# Patient Record
Sex: Male | Born: 1975
Health system: Southern US, Community
[De-identification: ages and names within clinical notes are randomized; demographics above are authoritative.]

## PROBLEM LIST (undated history)

## (undated) DIAGNOSIS — R740 Nonspecific elevation of levels of transaminase and lactic acid dehydrogenase [LDH]: Secondary | ICD-10-CM

## (undated) DIAGNOSIS — R7401 Elevation of levels of liver transaminase levels: Secondary | ICD-10-CM

## (undated) DIAGNOSIS — G40909 Epilepsy, unspecified, not intractable, without status epilepticus: Secondary | ICD-10-CM

## (undated) DIAGNOSIS — R7402 Elevation of levels of lactic acid dehydrogenase (LDH): Secondary | ICD-10-CM

## (undated) DIAGNOSIS — G809 Cerebral palsy, unspecified: Secondary | ICD-10-CM

## (undated) HISTORY — DX: Epilepsy, unspecified, not intractable, without status epilepticus: G40.909

## (undated) HISTORY — DX: Elevation of levels of liver transaminase levels: R74.01

## (undated) HISTORY — DX: Cerebral palsy, unspecified: G80.9

## (undated) HISTORY — DX: Elevation of levels of lactic acid dehydrogenase (LDH): R74.02

## (undated) HISTORY — DX: Nonspecific elevation of levels of transaminase and lactic acid dehydrogenase (ldh): R74.0

---

## 2003-02-24 ENCOUNTER — Encounter: Payer: Self-pay | Admitting: Internal Medicine

## 2005-03-28 ENCOUNTER — Ambulatory Visit: Payer: Self-pay | Admitting: Hospitalist

## 2006-08-14 ENCOUNTER — Telehealth (INDEPENDENT_AMBULATORY_CARE_PROVIDER_SITE_OTHER): Payer: Self-pay | Admitting: Hospitalist

## 2006-10-24 ENCOUNTER — Ambulatory Visit: Payer: Self-pay | Admitting: *Deleted

## 2006-10-24 DIAGNOSIS — G40909 Epilepsy, unspecified, not intractable, without status epilepticus: Secondary | ICD-10-CM

## 2006-10-24 DIAGNOSIS — G809 Cerebral palsy, unspecified: Secondary | ICD-10-CM | POA: Insufficient documentation

## 2007-12-31 ENCOUNTER — Telehealth: Payer: Self-pay | Admitting: *Deleted

## 2008-02-10 ENCOUNTER — Encounter: Payer: Self-pay | Admitting: Internal Medicine

## 2008-02-10 ENCOUNTER — Ambulatory Visit: Payer: Self-pay | Admitting: *Deleted

## 2008-02-13 ENCOUNTER — Encounter: Payer: Self-pay | Admitting: Licensed Clinical Social Worker

## 2008-02-13 LAB — CONVERTED CEMR LAB
ALT: 14 units/L (ref 0–53)
Basophils Absolute: 0 10*3/uL (ref 0.0–0.1)
CO2: 21 meq/L (ref 19–32)
Eosinophils Relative: 3 % (ref 0–5)
HCT: 41.9 % (ref 39.0–52.0)
Lymphocytes Relative: 20 % (ref 12–46)
Neutro Abs: 3.8 10*3/uL (ref 1.7–7.7)
Neutrophils Relative %: 70 % (ref 43–77)
Platelets: 218 10*3/uL (ref 150–400)
RDW: 13.5 % (ref 11.5–15.5)
Sodium: 148 meq/L — ABNORMAL HIGH (ref 135–145)
TSH: 1.549 microintl units/mL (ref 0.350–4.50)
Total Bilirubin: 0.4 mg/dL (ref 0.3–1.2)
Total Protein: 8.2 g/dL (ref 6.0–8.3)

## 2008-03-26 ENCOUNTER — Encounter: Payer: Self-pay | Admitting: Internal Medicine

## 2008-03-27 ENCOUNTER — Ambulatory Visit: Payer: Self-pay | Admitting: Internal Medicine

## 2008-03-27 DIAGNOSIS — M7989 Other specified soft tissue disorders: Secondary | ICD-10-CM

## 2008-05-18 ENCOUNTER — Encounter: Payer: Self-pay | Admitting: Internal Medicine

## 2009-01-04 ENCOUNTER — Ambulatory Visit: Payer: Self-pay | Admitting: Internal Medicine

## 2009-01-04 ENCOUNTER — Encounter: Payer: Self-pay | Admitting: Internal Medicine

## 2009-01-04 DIAGNOSIS — R740 Nonspecific elevation of levels of transaminase and lactic acid dehydrogenase [LDH]: Secondary | ICD-10-CM

## 2009-01-04 LAB — CONVERTED CEMR LAB
ALT: 11 units/L (ref 0–53)
Albumin: 4.1 g/dL (ref 3.5–5.2)
Alkaline Phosphatase: 75 units/L (ref 39–117)
Basophils Absolute: 0 10*3/uL (ref 0.0–0.1)
Basophils Relative: 0 % (ref 0–1)
Eosinophils Absolute: 0.2 10*3/uL (ref 0.0–0.7)
Glucose, Bld: 62 mg/dL — ABNORMAL LOW (ref 70–99)
HCV Ab: NEGATIVE
Hepatitis B Surface Ag: NEGATIVE
MCHC: 33.4 g/dL (ref 30.0–36.0)
MCV: 77.7 fL — ABNORMAL LOW (ref 78.0–100.0)
Monocytes Relative: 9 % (ref 3–12)
Neutro Abs: 3.4 10*3/uL (ref 1.7–7.7)
Neutrophils Relative %: 62 % (ref 43–77)
Platelets: 190 10*3/uL (ref 150–400)
RDW: 12.9 % (ref 11.5–15.5)
Total Bilirubin: 0.3 mg/dL (ref 0.3–1.2)
Total Protein: 7.3 g/dL (ref 6.0–8.3)

## 2009-01-12 ENCOUNTER — Encounter: Payer: Self-pay | Admitting: Internal Medicine

## 2009-01-27 ENCOUNTER — Telehealth: Payer: Self-pay | Admitting: Internal Medicine

## 2009-02-11 ENCOUNTER — Telehealth: Payer: Self-pay | Admitting: Internal Medicine

## 2009-02-26 ENCOUNTER — Ambulatory Visit: Payer: Self-pay | Admitting: Infectious Diseases

## 2009-02-26 ENCOUNTER — Encounter: Payer: Self-pay | Admitting: Internal Medicine

## 2009-02-26 DIAGNOSIS — L851 Acquired keratosis [keratoderma] palmaris et plantaris: Secondary | ICD-10-CM

## 2009-07-19 ENCOUNTER — Encounter: Payer: Self-pay | Admitting: Internal Medicine

## 2009-07-26 ENCOUNTER — Encounter: Payer: Self-pay | Admitting: Internal Medicine

## 2009-12-23 ENCOUNTER — Encounter: Payer: Self-pay | Admitting: Ophthalmology

## 2009-12-23 ENCOUNTER — Ambulatory Visit: Payer: Self-pay | Admitting: Internal Medicine

## 2009-12-23 ENCOUNTER — Encounter: Payer: Self-pay | Admitting: Internal Medicine

## 2009-12-24 LAB — CONVERTED CEMR LAB
AST: 14 units/L (ref 0–37)
Albumin: 4.5 g/dL (ref 3.5–5.2)
BUN: 16 mg/dL (ref 6–23)
Basophils Absolute: 0 10*3/uL (ref 0.0–0.1)
CO2: 23 meq/L (ref 19–32)
Calcium: 9.8 mg/dL (ref 8.4–10.5)
Chloride: 109 meq/L (ref 96–112)
Cholesterol: 170 mg/dL (ref 0–200)
Hemoglobin: 14.5 g/dL (ref 13.0–17.0)
Lymphocytes Relative: 19 % (ref 12–46)
Lymphs Abs: 1.3 10*3/uL (ref 0.7–4.0)
Monocytes Absolute: 0.5 10*3/uL (ref 0.1–1.0)
Neutro Abs: 4.9 10*3/uL (ref 1.7–7.7)
Platelets: 236 10*3/uL (ref 150–400)
Potassium: 3.8 meq/L (ref 3.5–5.3)
RDW: 12.7 % (ref 11.5–15.5)
Triglycerides: 49 mg/dL (ref <150)
WBC: 6.7 10*3/uL (ref 4.0–10.5)

## 2010-06-08 ENCOUNTER — Encounter: Payer: Self-pay | Admitting: Ophthalmology

## 2010-07-26 NOTE — Assessment & Plan Note (Signed)
Summary: ACUTE-REGULAR CHECK UP([facility]O)/CFB   Vital Signs:  Patient profile:   35 year old male Height:      67 inches (170.18 cm) Pulse rate:   92 / minute BP sitting:   128 / 81  (left arm) Cuff size:   small  Vitals Entered By: Theotis Barrio NT II (December 23, 2009 2:13 PM) CC: YEARLY CLINIC VISIT  /  MEDICATION REFILL /  FORMS TO BE FILLED OUT / HERE WITH SISITER Austin [name] Is Patient Diabetic? No Pain Assessment Patient in pain? no       Does patient need assistance? Ambulation Impaired:Risk for fall Comments PATIENT IS TOTAL CARE / HIS CARE TAKER IS HIS SISTER Austin [name]   Primary Care Provider:  Hartley Barefoot MD  CC:  YEARLY CLINIC VISIT  /  MEDICATION REFILL /  FORMS TO BE FILLED OUT / HERE WITH SISITER Austin [name].  History of Present Illness: Austin [name] is a 35 yo man with PMH as outlined below.  Specifically he has what appears to be severe cerebral palsy and does not really communicate during the interview.  He is here with his sister who denies any active issues.  He is taking his medication for seizures without problems.  Eating well and interacting at baseline.  They are here for refills and some paperwork.  Of note, he is only taking carbamazepine once daily.  Preventive Screening-Counseling & Management  Alcohol-Tobacco     Smoking Status: never  Current Medications (verified): 1)  Carbamazepine 200 Mg Tabs (Carbamazepine) .... Take 1 Tablet By Mouth Once A Day  Allergies (verified): No Known Drug Allergies  Past History:  Past Medical History: Last updated: 10/24/2006 Cerebral Palsy Seizure Disorder  Risk Factors: Smoking Status: never (12/23/2009)  Physical Exam  General:  sitting in wheechair, constant moving, occaisonal random sounds. Head:  large area of alopecia Eyes:  anicteric Lungs:  normal respiratory effort, no accessory muscle use, normal breath sounds, no crackles, and no wheezes.   Heart:  normal rate, regular rhythm, no murmur, no  gallop, and no rub.   Abdomen:  normal bowel sounds.   Extremities:  no edema Neurologic:  mentally retarded. wheel chair bound. unable to assess the orientation.  Psych:  unable to assess   Impression & Recommendations:  Problem # 1:  SEIZURE DISORDER (ICD-780.39) will check level (should be drawn before morning dose), renal fnx, LFTs, lipids, CBC Continue current medication Paperwork filled out  >25 minutes face to face  His updated medication list for this problem includes:    Carbamazepine 200 Mg Tabs (Carbamazepine) .Marland Kitchen... Take 1 tablet by mouth once a day  Problem # 2:  CEREBRAL PALSY (ICD-343.9) At baseline per sister.  Complete Medication List: 1)  Carbamazepine 200 Mg Tabs (Carbamazepine) .... Take 1 tablet by mouth once a day  Other Orders: T-Comprehensive Metabolic Panel [phone]) T-Lipid Profile ([id number]) T-CBC w/Diff ([id number]) T- * Misc. Laboratory test [phone]) Ophthalmology Referral (Ophthalmology)  Patient Instructions: 1)  Please schedule a follow-up appointment in 6 months. 2)  Continue medications below. 3)  Will check labs today, if there is any problem we will call you. 4)  Make sure to contact Austin [name] about transportation question. 5)  Will need an eye exam becasue of the carbamazepine. 6)  Medication refilled. Prescriptions: CARBAMAZEPINE 200 MG TABS (CARBAMAZEPINE) Take 1 tablet by mouth once a day  #30 x 6   Entered and Authorized by:   Mariea Stable MD   Signed by:   Mariea Stable  MD on 12/23/2009   Method used:   [facility] to        [facility]  [address] [phone]* (retail)       [address]       [place], [place]  [address]       Ph: [phone] or [phone]       Fax: [phone]   RxID:   [id number]  Process Orders Check Orders Results:     [facility]: ABN not required for this insurance Tests Sent for requisitioning (December 23, 2009 3:16 PM):     12/23/2009: [facility] -- T-Comprehensive Metabolic Panel [[id number]] (signed)     12/23/2009: [facility] -- T-Lipid Profile [id number] (signed)     12/23/2009: [facility] -- T-CBC w/Diff [[id number]] (signed)     12/23/2009: [facility] -- T- * Misc. Laboratory test [phone] (signed)    Prevention & Chronic Care Immunizations   Influenza vaccine: Not documented    Tetanus booster: 01/04/2009: Td    Pneumococcal vaccine: Not documented  Other Screening   Smoking status: never  (12/23/2009)  Lipids   Total Cholesterol: Not documented   LDL: Not documented   LDL Direct: Not documented   HDL: Not documented   Triglycerides: Not documented   Appended Document: ACUTE-REGULAR CHECK UP([facility]O)/CFB    Clinical Lists Changes        Problem # 13:  ENCOUNTER FOR THERAPEUTIC DRUG MONITORING (ICD-V58.83) ophthalmology referral ordered during visit for chronic carbamazepine use  Problem # 14:  SEIZURE DISORDER (ICD-780.39) ophthalmology referral ordered during visit for chronic carbamazepine use  His updated medication list for this problem includes:    Carbamazepine 200 Mg Tabs (Carbamazepine) .Marland Kitchen... Take 1 tablet by mouth once a day   Complete Medication List: 1)  Carbamazepine 200 Mg Tabs (Carbamazepine) .... Take 1 tablet by mouth once a day

## 2010-07-26 NOTE — Miscellaneous (Signed)
Summary: [facility]: Verbal Order  [facility]: Verbal Order   Imported By: Florinda Marker 07/20/2009 13:44:35  _____________________________________________________________________  External Attachment:    Type:   Image     Comment:   External Document

## 2010-07-26 NOTE — Miscellaneous (Signed)
Summary: [facility]: Verbal Order  [facility]: Verbal Order   Imported By: Florinda Marker 07/28/2009 16:35:37  _____________________________________________________________________  External Attachment:    Type:   Image     Comment:   External Document

## 2010-07-26 NOTE — Consult Note (Signed)
Summary: [facility]  [facility]   Imported By: Margie Billet 12/28/2009 11:41:32  _____________________________________________________________________  External Attachment:    Type:   Image     Comment:   External Document

## 2010-07-28 NOTE — Miscellaneous (Signed)
Summary: [facility] ORDERS  ADVANCED ORDERS   Imported By: Shon Hough 07/08/2010 13:44:56  _____________________________________________________________________  External Attachment:    Type:   Image     Comment:   External Document

## 2010-08-08 ENCOUNTER — Ambulatory Visit (INDEPENDENT_AMBULATORY_CARE_PROVIDER_SITE_OTHER): Payer: Medicare Other | Admitting: Ophthalmology

## 2010-08-08 ENCOUNTER — Encounter: Payer: Self-pay | Admitting: Ophthalmology

## 2010-08-08 DIAGNOSIS — L659 Nonscarring hair loss, unspecified: Secondary | ICD-10-CM | POA: Insufficient documentation

## 2010-08-08 DIAGNOSIS — G809 Cerebral palsy, unspecified: Secondary | ICD-10-CM

## 2010-08-08 DIAGNOSIS — R569 Unspecified convulsions: Secondary | ICD-10-CM

## 2010-08-08 LAB — COMPREHENSIVE METABOLIC PANEL
Albumin: 4.2 g/dL (ref 3.5–5.2)
BUN: 14 mg/dL (ref 6–23)
Calcium: 9 mg/dL (ref 8.4–10.5)
Chloride: 107 mEq/L (ref 96–112)
Glucose, Bld: 95 mg/dL (ref 70–99)
Potassium: 3.7 mEq/L (ref 3.5–5.3)

## 2010-08-08 MED ORDER — CARBAMAZEPINE 200 MG PO TABS: 200.0000 mg | ORAL_TABLET | Freq: Every day | ORAL | Status: DC

## 2010-08-08 NOTE — Assessment & Plan Note (Addendum)
Given that the patient has full care requirements including bathing , I will write a letter to the home health agency today stating the patient's medical conditions requiring this assistance.  Because the patient's needs and capabilities were not completely clear at this time, I did order a referral for physical therapy to see the patient to  determine which durable medical equipment may best serve the patient and his family.  We will prescribe based on their recommendations.

## 2010-08-08 NOTE — Assessment & Plan Note (Addendum)
The patient is currently on carbamazepine, in the sister denies any new seizures. We'll continue the patient's current dose at this time. I will  Refill his medication today.  Also check a repeat seen at the followup on the patient's hepatic function given his Tegretol use.

## 2010-08-08 NOTE — Patient Instructions (Addendum)
You'll need to get her medical records to get the past progress notes. I'm referring the patient for physical therapy evaluation , so that they can assess his needs and determine what medical clinic he really needs. You can pick up a ruber cover for your finger while brushing [facility] at [facility] or other similar stores. I like to see the patient again in 3 months for routine checkup. Please call earlier if you have further concerns.

## 2010-08-08 NOTE — Assessment & Plan Note (Signed)
The patient has a significant area of alopecia across his posterior scalp. There are several pustules surrounding the periphery. This is reportedly stable, another concern to the patient or family. I will Continue to monitor this,   And Will refer him to dermatology if it worsens.

## 2010-08-08 NOTE — Progress Notes (Signed)
  Subjective:    Patient ID: Austin [name], male    DOB: 1975/08/13, 35 y.o.   MRN: [id number]  HPI  This is a 35 year old male with past medical history significant for cerebral palsy and seizure disorder, who presents  For routine followup.  No history is obtainable from the patient due to his mental retardation, however, the patient's guardian was with him today. The patient reportedly has no new problems, and his seizures have been under good control. The patient's sister has currently been taking care the patient full time, and needs assistance with this. The patient has now been arranged to receive home health care with a nursing aide,  but requests a note defining his medical condition at this time.  The patient's sister also requests prescriptions for several durable medical items, including a walker which would allow the patient to stand independently while strapped in/supported,  and a rubber tip to go over her finger when trying to brush his teeth. The patient may be contacted at [phone] if needed.  Review of Systems Unable to obtain.     Objective:   Physical Exam  Constitutional: He appears well-developed and well-nourished.  HENT:  Head: Normocephalic and atraumatic.        The patient has significant alopecia which appears to be alopecia areata located on the posterior scalp. This is reportedly stable.  Eyes: Pupils are equal, round, and reactive to light.  Cardiovascular: Normal rate, regular rhythm and intact distal pulses.  Exam reveals no gallop and no friction rub.   No murmur heard. Pulmonary/Chest: Effort normal and breath sounds normal. He has no wheezes. He has no rales.  Abdominal: Soft. Bowel sounds are normal. He exhibits no distension. There is no tenderness.  Musculoskeletal: Normal range of motion.  Neurological: He is alert. No cranial nerve deficit.  Skin: Rash noted.       See hent   Psychiatric:        The patient is mentally retarded and incapable of  cooperating with physical exam.       Current Outpatient Prescriptions on File Prior to Visit  Medication Sig Dispense Refill  . DISCONTD: carbamazepine (TEGRETOL) 200 MG tablet Take 200 mg by mouth daily.          Past Medical History  Diagnosis Date  . Nonspecific elevation of levels of transaminase and lactic acid dehydrogenase (LDH)   . Seizure disorder   . Cerebral palsy       Assessment & Plan:

## 2010-08-22 ENCOUNTER — Telehealth: Payer: Self-pay | Admitting: *Deleted

## 2010-08-22 ENCOUNTER — Ambulatory Visit: Payer: Medicare Other | Admitting: Physical Therapy

## 2010-08-22 NOTE — Telephone Encounter (Signed)
[facility] after being notified [facility] had failed

## 2010-08-29 ENCOUNTER — Encounter: Payer: Self-pay | Admitting: Ophthalmology

## 2010-11-16 ENCOUNTER — Encounter: Payer: Medicare Other | Admitting: Ophthalmology

## 2010-11-16 ENCOUNTER — Ambulatory Visit (INDEPENDENT_AMBULATORY_CARE_PROVIDER_SITE_OTHER): Payer: Medicare Other | Admitting: Internal Medicine

## 2010-11-16 ENCOUNTER — Encounter: Payer: Self-pay | Admitting: Internal Medicine

## 2010-11-16 VITALS — BP 130/88 | HR 68 | Temp 98.6°F | Ht 68.0 in

## 2010-11-16 DIAGNOSIS — L0292 Furuncle, unspecified: Secondary | ICD-10-CM

## 2010-11-16 DIAGNOSIS — L0293 Carbuncle, unspecified: Secondary | ICD-10-CM

## 2010-11-16 DIAGNOSIS — Z0289 Encounter for other administrative examinations: Secondary | ICD-10-CM

## 2010-11-16 DIAGNOSIS — R569 Unspecified convulsions: Secondary | ICD-10-CM

## 2010-11-16 DIAGNOSIS — L219 Seborrheic dermatitis, unspecified: Secondary | ICD-10-CM

## 2010-11-16 MED ORDER — CARBAMAZEPINE 200 MG PO TABS: 200.0000 mg | ORAL_TABLET | Freq: Every day | ORAL | Status: DC

## 2010-11-16 MED ORDER — KETOCONAZOLE 2 % EX SHAM: MEDICATED_SHAMPOO | CUTANEOUS | Status: AC

## 2010-11-16 NOTE — Progress Notes (Signed)
  Subjective:    Patient ID: Austin [name], male    DOB: 03/07/1976, 35 y.o.   MRN: [id number]  HPI Patient is a 35 year old man with cerebral palsy, moderate to severe MR, requiring total care from his sister, who also cares for another sibling (sister) with mild MR. He is non-verbal. Has a history of grand mal siezures with good control on Tegretol-last seizure 2 months ago, which did not prompt evaluation.  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 No current fluctuance, fevers or other systemic symptoms (such as listlesness or decreased po intake) His sister notes he does quite a bit of head bobbing and repetitive head movements, which may aggravate his scalp. Scant drainage on the pillow. Last visit to a dermatologist-she cannot recall who-in [place] 6-7 years ago.  Also has seborrheah  Good appetite, fluid intake-eats solid food without difficulty.   Review of Systems Not obtainable from patient-as above    Objective:   Physical Exam  HENT:  Head:     Slightly agitated man in wheelchair, bobbing back and forth, not verbal OP-very poor dentition Neck supple, FROM Lungs CTA CAR RRR no MRG Ext- no lesions       Assessment & Plan:  1. Multi-page forms filled in for daycare program-see scanned paperwork, and check PPD 2. Dermatology referral 3. Nizoril for seborrhea 4. Tegretol level

## 2010-11-17 LAB — CARBAMAZEPINE LEVEL, TOTAL: Carbamazepine Lvl: 4.8 ug/mL (ref 4.0–12.0)

## 2010-11-23 ENCOUNTER — Encounter: Payer: Medicare Other | Admitting: Ophthalmology

## 2011-03-06 ENCOUNTER — Telehealth: Payer: Self-pay | Admitting: *Deleted

## 2011-03-06 NOTE — Telephone Encounter (Signed)
Call from [facility] stating pt has requested PCS from her company and they need records stating pt's diag and need of PCS,  Given med records fax# for release of info and request

## 2011-04-19 ENCOUNTER — Other Ambulatory Visit: Payer: Self-pay | Admitting: Ophthalmology

## 2011-09-15 ENCOUNTER — Inpatient Hospital Stay (HOSPITAL_COMMUNITY)
Admission: EM | Admit: 2011-09-15 | Discharge: 2011-09-19 | DRG: 572 | Disposition: A | Discharge status: Home / Self Care | Payer: Medicare Other | Source: Ambulatory Visit | Attending: Internal Medicine | Admitting: Internal Medicine

## 2011-09-15 ENCOUNTER — Emergency Department (HOSPITAL_COMMUNITY): Payer: Medicare Other

## 2011-09-15 ENCOUNTER — Encounter (HOSPITAL_COMMUNITY): Payer: Self-pay | Admitting: Emergency Medicine

## 2011-09-15 DIAGNOSIS — L659 Nonscarring hair loss, unspecified: Secondary | ICD-10-CM | POA: Diagnosis present

## 2011-09-15 DIAGNOSIS — G40909 Epilepsy, unspecified, not intractable, without status epilepticus: Secondary | ICD-10-CM | POA: Diagnosis present

## 2011-09-15 DIAGNOSIS — L089 Local infection of the skin and subcutaneous tissue, unspecified: Secondary | ICD-10-CM | POA: Diagnosis not present

## 2011-09-15 DIAGNOSIS — L02519 Cutaneous abscess of unspecified hand: Secondary | ICD-10-CM | POA: Diagnosis not present

## 2011-09-15 DIAGNOSIS — G809 Cerebral palsy, unspecified: Secondary | ICD-10-CM | POA: Diagnosis not present

## 2011-09-15 DIAGNOSIS — M7989 Other specified soft tissue disorders: Secondary | ICD-10-CM | POA: Diagnosis present

## 2011-09-15 DIAGNOSIS — L03119 Cellulitis of unspecified part of limb: Secondary | ICD-10-CM

## 2011-09-15 DIAGNOSIS — D72829 Elevated white blood cell count, unspecified: Secondary | ICD-10-CM | POA: Diagnosis present

## 2011-09-15 DIAGNOSIS — Z79899 Other long term (current) drug therapy: Secondary | ICD-10-CM

## 2011-09-15 DIAGNOSIS — L0292 Furuncle, unspecified: Secondary | ICD-10-CM | POA: Diagnosis present

## 2011-09-15 DIAGNOSIS — L02529 Furuncle unspecified hand: Secondary | ICD-10-CM | POA: Diagnosis present

## 2011-09-15 DIAGNOSIS — L02539 Carbuncle of unspecified hand: Secondary | ICD-10-CM | POA: Diagnosis present

## 2011-09-15 DIAGNOSIS — D649 Anemia, unspecified: Secondary | ICD-10-CM | POA: Diagnosis present

## 2011-09-15 LAB — BASIC METABOLIC PANEL
CO2: 22 mEq/L (ref 19–32)
Calcium: 9.3 mg/dL (ref 8.4–10.5)
Chloride: 104 mEq/L (ref 96–112)
Glucose, Bld: 126 mg/dL — ABNORMAL HIGH (ref 70–99)
Potassium: 3.7 mEq/L (ref 3.5–5.1)
Sodium: 139 mEq/L (ref 135–145)

## 2011-09-15 LAB — DIFFERENTIAL
Basophils Relative: 0 % (ref 0–1)
Eosinophils Relative: 0 % (ref 0–5)
Lymphocytes Relative: 3 % — ABNORMAL LOW (ref 12–46)
Monocytes Absolute: 2.3 10*3/uL — ABNORMAL HIGH (ref 0.1–1.0)
Monocytes Relative: 9 % (ref 3–12)
Neutrophils Relative %: 88 % — ABNORMAL HIGH (ref 43–77)

## 2011-09-15 LAB — CBC
Hemoglobin: 13.5 g/dL (ref 13.0–17.0)
MCH: 26.5 pg (ref 26.0–34.0)
MCHC: 34.4 g/dL (ref 30.0–36.0)
MCV: 77 fL — ABNORMAL LOW (ref 78.0–100.0)
RBC: 5.09 MIL/uL (ref 4.22–5.81)

## 2011-09-15 MED ORDER — SODIUM CHLORIDE 0.9 % IV BOLUS (SEPSIS)
1000.0000 mL | Freq: Once | INTRAVENOUS | Status: AC
  Administered 2011-09-15: 1000 mL via INTRAVENOUS

## 2011-09-15 MED ORDER — LORAZEPAM 2 MG/ML IJ SOLN
1.0000 mg | INTRAMUSCULAR | Status: DC
  Administered 2011-09-16 (×2): 1 mg via INTRAVENOUS
  Filled 2011-09-15 (×2): qty 1

## 2011-09-15 MED ORDER — CLINDAMYCIN PHOSPHATE 600 MG/50ML IV SOLN
600.0000 mg | Freq: Once | INTRAVENOUS | Status: AC
  Administered 2011-09-15: 600 mg via INTRAVENOUS
  Filled 2011-09-15: qty 50

## 2011-09-15 NOTE — ED Notes (Signed)
Onset 3 days ago possible spider left hand currently left hand red swollen and painful.  Family member at beside states history of CP in wheelchair and has a care taker at home.

## 2011-09-15 NOTE — ED Provider Notes (Signed)
History     CSN: [id number]  Arrival date & time 09/15/11  1755   First MD Initiated Contact with Patient 09/15/11 2027      Chief Complaint  Patient presents with  . Insect Bite    (Consider location/radiation/quality/duration/timing/severity/associated sxs/prior treatment) HPI Comments: Patient with a history of Cerebral palsy and seizures presents emergency department with chief complaint of left hand and thumb pain.  Patient was brought in the emergency department by his nephew who has provided history.  Onset of pain is unknown because patient's wife left town on Tuesday and his caretaker did not notice the hand for 3 days.  Associated symptoms include swelling, your edema, fevers, and purulent drainage.  Patient unable to respond to other review of system questions-level V caveat. No known hx of DM, steroids, CA, or IV use. Last meal unknown.   The history is provided by the patient.    Past Medical History  Diagnosis Date  . Nonspecific elevation of levels of transaminase and lactic acid dehydrogenase (LDH)   . Seizure disorder   . Cerebral palsy     History reviewed. No pertinent past surgical history.  No family history on file.  History  Substance Use Topics  . Smoking status: Never Smoker   . Smokeless tobacco: Not on file  . Alcohol Use: No      Review of Systems  Unable to perform ROS: Other    Allergies  Review of patient's allergies indicates no known allergies.  Home Medications   Current Outpatient Rx  Name Route Sig Dispense Refill  . CARBAMAZEPINE ER 200 MG PO CP12 Oral Take 200 mg by mouth daily.      BP 130/100  Pulse 120  Temp(Src) 98.4 F (36.9 C) (Axillary)  Resp 22  SpO2 98%  Physical Exam  Nursing note and vitals reviewed. Constitutional: He is oriented to person, place, and time. He appears well-developed and well-nourished. No distress.       Baseline non commutative, wheelchair bound. Appears to have bad social care, not  clean.    HENT:  Head: Normocephalic and atraumatic.  Eyes: Conjunctivae and EOM are normal.  Neck: Normal range of motion.  Cardiovascular: Normal heart sounds.        Mild tachycardia 106  Pulmonary/Chest: Effort normal.  Musculoskeletal: Normal range of motion.       Left hand swollen from 3rd digit to thumb. Erythema, swelling, warmth & blistering of skin on both palmer and dorsal surface. Extreme ttp. 1st digit with purulent drainage, skin sloughing and malodor.   Neurological: He is alert and oriented to person, place, and time.  Skin: Skin is warm and dry. No rash noted. He is not diaphoretic.  Psychiatric: He has a normal mood and affect. His behavior is normal.    ED Course  Procedures (including critical care time)  Labs Reviewed  CBC - Abnormal; Notable for the following:    WBC 25.8 (*)    MCV 77.0 (*)    All other components within normal limits  DIFFERENTIAL - Abnormal; Notable for the following:    Neutrophils Relative 88 (*)    Lymphocytes Relative 3 (*)    Neutro Abs 22.7 (*)    Monocytes Absolute 2.3 (*)    All other components within normal limits  BASIC METABOLIC PANEL - Abnormal; Notable for the following:    Glucose, Bld 126 (*)    All other components within normal limits  CULTURE, BLOOD (ROUTINE X 2)  CULTURE,  BLOOD (ROUTINE X 2)   Dg Hand Complete Left  09/15/2011  *RADIOLOGY REPORT*  Clinical Data: Severe left hand swelling and infection.  Evaluate for osteomyelitis.  LEFT HAND - COMPLETE 3+ VIEW  Comparison: None.  Findings: Soft tissue swelling is seen involving the palm and proximal index finger.  No evidence of bone destruction, lucency, or periosteal reaction.  No evidence of fracture or dislocation. No evidence of arthropathy.  No evidence of soft tissue gas or radiopaque foreign body.  IMPRESSION: Soft tissue swelling involving the palmar and proximal index finger.  No osseous abnormality.  Original Report Authenticated By: Danae Orleans, M.D.      1. Cellulitis of hand       MDM  Labs, BC, and xray pending. R/o osteomyelitis vs simple cellulitis. Started on clindamycin. Likely admit d/t level of infection, comorbidities and inappropriate home health care.   No osteo on xray, Hand surgery consulted for likely surgery.    Pt has been admitted to [facility] and is in line for hand surgery. Pt given ativan in ED for anxiety and seizure prevention. Discussed plan with patient family who is agreeable.         Jaci Carrel, [place] 09/16/11 [phone]

## 2011-09-15 NOTE — ED Notes (Signed)
Patient presents with family stating he has some kind of insect bite to his left hand.  Left hand swollen, red, warm to touch with yellowish drainage

## 2011-09-16 ENCOUNTER — Encounter (HOSPITAL_COMMUNITY): Payer: Self-pay | Admitting: Internal Medicine

## 2011-09-16 ENCOUNTER — Emergency Department (HOSPITAL_COMMUNITY): Payer: Medicare Other | Admitting: Anesthesiology

## 2011-09-16 ENCOUNTER — Encounter (HOSPITAL_COMMUNITY)
Admission: EM | Disposition: A | Discharge status: Home / Self Care | Payer: Self-pay | Source: Ambulatory Visit | Attending: Internal Medicine

## 2011-09-16 ENCOUNTER — Encounter (HOSPITAL_COMMUNITY): Payer: Self-pay | Admitting: Anesthesiology

## 2011-09-16 DIAGNOSIS — D649 Anemia, unspecified: Secondary | ICD-10-CM | POA: Diagnosis present

## 2011-09-16 DIAGNOSIS — L02519 Cutaneous abscess of unspecified hand: Secondary | ICD-10-CM | POA: Diagnosis not present

## 2011-09-16 DIAGNOSIS — L02529 Furuncle unspecified hand: Secondary | ICD-10-CM | POA: Diagnosis not present

## 2011-09-16 DIAGNOSIS — M7989 Other specified soft tissue disorders: Secondary | ICD-10-CM | POA: Diagnosis not present

## 2011-09-16 DIAGNOSIS — D7289 Other specified disorders of white blood cells: Secondary | ICD-10-CM | POA: Diagnosis not present

## 2011-09-16 DIAGNOSIS — L089 Local infection of the skin and subcutaneous tissue, unspecified: Secondary | ICD-10-CM | POA: Diagnosis not present

## 2011-09-16 DIAGNOSIS — L659 Nonscarring hair loss, unspecified: Secondary | ICD-10-CM | POA: Diagnosis present

## 2011-09-16 DIAGNOSIS — Z79899 Other long term (current) drug therapy: Secondary | ICD-10-CM | POA: Diagnosis not present

## 2011-09-16 DIAGNOSIS — D72829 Elevated white blood cell count, unspecified: Secondary | ICD-10-CM | POA: Diagnosis present

## 2011-09-16 DIAGNOSIS — G809 Cerebral palsy, unspecified: Secondary | ICD-10-CM | POA: Diagnosis not present

## 2011-09-16 DIAGNOSIS — R112 Nausea with vomiting, unspecified: Secondary | ICD-10-CM | POA: Diagnosis not present

## 2011-09-16 DIAGNOSIS — G40909 Epilepsy, unspecified, not intractable, without status epilepticus: Secondary | ICD-10-CM | POA: Diagnosis present

## 2011-09-16 DIAGNOSIS — L03119 Cellulitis of unspecified part of limb: Secondary | ICD-10-CM | POA: Diagnosis not present

## 2011-09-16 HISTORY — PX: I & D EXTREMITY: SHX5045

## 2011-09-16 LAB — CBC
MCH: 26.7 pg (ref 26.0–34.0)
MCHC: 34.9 g/dL (ref 30.0–36.0)
Platelets: 181 10*3/uL (ref 150–400)
RBC: 4.72 MIL/uL (ref 4.22–5.81)
RDW: 13 % (ref 11.5–15.5)

## 2011-09-16 LAB — CREATININE, SERUM: Creatinine, Ser: 0.83 mg/dL (ref 0.50–1.35)

## 2011-09-16 SURGERY — IRRIGATION AND DEBRIDEMENT EXTREMITY
Anesthesia: General | Site: Hand | Laterality: Left | Wound class: Dirty or Infected

## 2011-09-16 MED ORDER — PIPERACILLIN-TAZOBACTAM 3.375 G IVPB 30 MIN
3.3750 g | Freq: Three times a day (TID) | INTRAVENOUS | Status: DC
  Administered 2011-09-16 – 2011-09-19 (×11): 3.375 g via INTRAVENOUS
  Filled 2011-09-16 (×14): qty 50

## 2011-09-16 MED ORDER — ONDANSETRON HCL 4 MG PO TABS: 4.0000 mg | ORAL_TABLET | Freq: Four times a day (QID) | ORAL | Status: DC | PRN

## 2011-09-16 MED ORDER — GLYCOPYRROLATE 0.2 MG/ML IJ SOLN
INTRAMUSCULAR | Status: DC | PRN
  Administered 2011-09-16: .5 mg via INTRAVENOUS

## 2011-09-16 MED ORDER — SODIUM CHLORIDE 0.9 % IV SOLN
INTRAVENOUS | Status: AC
  Administered 2011-09-16: 100 mL/h via INTRAVENOUS

## 2011-09-16 MED ORDER — NEOSTIGMINE METHYLSULFATE 1 MG/ML IJ SOLN
INTRAMUSCULAR | Status: DC | PRN
  Administered 2011-09-16: 3 mg via INTRAVENOUS

## 2011-09-16 MED ORDER — FENTANYL CITRATE 0.05 MG/ML IJ SOLN
INTRAMUSCULAR | Status: DC | PRN
  Administered 2011-09-16 (×2): 50 ug via INTRAVENOUS

## 2011-09-16 MED ORDER — BUPIVACAINE HCL (PF) 0.25 % IJ SOLN
INTRAMUSCULAR | Status: DC | PRN
  Administered 2011-09-16: 10 mL

## 2011-09-16 MED ORDER — CARBAMAZEPINE ER 200 MG PO CP12: 200.0000 mg | ORAL_CAPSULE | Freq: Every day | ORAL | Status: DC

## 2011-09-16 MED ORDER — MIDAZOLAM HCL 5 MG/5ML IJ SOLN
INTRAMUSCULAR | Status: DC | PRN
  Administered 2011-09-16: 2 mg via INTRAVENOUS

## 2011-09-16 MED ORDER — PROPOFOL 10 MG/ML IV EMUL
INTRAVENOUS | Status: DC | PRN
  Administered 2011-09-16: 50 mg via INTRAVENOUS
  Administered 2011-09-16: 150 mg via INTRAVENOUS

## 2011-09-16 MED ORDER — ONDANSETRON HCL 4 MG/2ML IJ SOLN: 4.0000 mg | Freq: Four times a day (QID) | INTRAMUSCULAR | Status: DC | PRN

## 2011-09-16 MED ORDER — CARBAMAZEPINE ER 200 MG PO TB12
200.0000 mg | ORAL_TABLET | Freq: Every day | ORAL | Status: DC
  Administered 2011-09-16 – 2011-09-19 (×4): 200 mg via ORAL
  Filled 2011-09-16 (×4): qty 1

## 2011-09-16 MED ORDER — OXYCODONE-ACETAMINOPHEN 5-325 MG PO TABS
1.0000 | ORAL_TABLET | Freq: Four times a day (QID) | ORAL | Status: DC | PRN
  Administered 2011-09-18: 2 via ORAL
  Administered 2011-09-18: 1 via ORAL
  Administered 2011-09-19: 2 via ORAL
  Filled 2011-09-16 (×2): qty 2
  Filled 2011-09-16: qty 1

## 2011-09-16 MED ORDER — LORAZEPAM 2 MG/ML IJ SOLN
1.0000 mg | Freq: Four times a day (QID) | INTRAMUSCULAR | Status: DC | PRN
  Administered 2011-09-19: 03:00:00 via INTRAVENOUS
  Administered 2011-09-19: 1 mg via INTRAVENOUS
  Filled 2011-09-16 (×2): qty 1

## 2011-09-16 MED ORDER — CLINDAMYCIN PHOSPHATE 900 MG/50ML IV SOLN
900.0000 mg | Freq: Three times a day (TID) | INTRAVENOUS | Status: DC
  Filled 2011-09-16 (×2): qty 50

## 2011-09-16 MED ORDER — ROCURONIUM BROMIDE 100 MG/10ML IV SOLN
INTRAVENOUS | Status: DC | PRN
  Administered 2011-09-16: 30 mg via INTRAVENOUS

## 2011-09-16 MED ORDER — LACTATED RINGERS IV SOLN
INTRAVENOUS | Status: DC | PRN
  Administered 2011-09-16 (×2): via INTRAVENOUS

## 2011-09-16 MED ORDER — FENTANYL CITRATE 0.05 MG/ML IJ SOLN: 25.0000 ug | INTRAMUSCULAR | Status: DC | PRN

## 2011-09-16 MED ORDER — ONDANSETRON HCL 4 MG/2ML IJ SOLN: 4.0000 mg | INTRAMUSCULAR | Status: DC | PRN

## 2011-09-16 MED ORDER — SODIUM CHLORIDE 0.9 % IR SOLN
Status: DC | PRN
  Administered 2011-09-16 (×2): 3000 mL

## 2011-09-16 MED ORDER — ONDANSETRON HCL 4 MG/2ML IJ SOLN
INTRAMUSCULAR | Status: DC | PRN
  Administered 2011-09-16: 4 mg via INTRAVENOUS

## 2011-09-16 MED ORDER — HEPARIN SODIUM (PORCINE) 5000 UNIT/ML IJ SOLN
5000.0000 [IU] | Freq: Three times a day (TID) | INTRAMUSCULAR | Status: DC
  Administered 2011-09-16 – 2011-09-19 (×9): 5000 [IU] via SUBCUTANEOUS
  Filled 2011-09-16 (×12): qty 1

## 2011-09-16 MED ORDER — WHITE PETROLATUM GEL
Status: AC
  Administered 2011-09-16: 14:00:00
  Filled 2011-09-16: qty 5

## 2011-09-16 MED ORDER — MORPHINE SULFATE 2 MG/ML IJ SOLN
2.0000 mg | INTRAMUSCULAR | Status: DC | PRN
  Administered 2011-09-18 – 2011-09-19 (×3): 2 mg via INTRAVENOUS
  Filled 2011-09-16 (×3): qty 1

## 2011-09-16 MED ORDER — METOPROLOL TARTRATE 1 MG/ML IV SOLN
INTRAVENOUS | Status: DC | PRN
  Administered 2011-09-16 (×5): 1 mg via INTRAVENOUS

## 2011-09-16 MED ORDER — VANCOMYCIN HCL IN DEXTROSE 1-5 GM/200ML-% IV SOLN
1000.0000 mg | Freq: Two times a day (BID) | INTRAVENOUS | Status: DC
  Administered 2011-09-16 – 2011-09-19 (×7): 1000 mg via INTRAVENOUS
  Filled 2011-09-16 (×10): qty 200

## 2011-09-16 MED ORDER — MORPHINE SULFATE 2 MG/ML IJ SOLN
2.0000 mg | Freq: Once | INTRAMUSCULAR | Status: AC
  Administered 2011-09-16: 2 mg via INTRAVENOUS
  Filled 2011-09-16: qty 1

## 2011-09-16 MED ORDER — SODIUM CHLORIDE 0.9 % IV SOLN
INTRAVENOUS | Status: DC
  Administered 2011-09-16 – 2011-09-17 (×3): via INTRAVENOUS

## 2011-09-16 SURGICAL SUPPLY — 54 items
BANDAGE COBAN STERILE 2 (GAUZE/BANDAGES/DRESSINGS) IMPLANT
BANDAGE CONFORM 2  STR LF (GAUZE/BANDAGES/DRESSINGS) IMPLANT
BANDAGE ELASTIC 3 VELCRO ST LF (GAUZE/BANDAGES/DRESSINGS) ×1 IMPLANT
BANDAGE ELASTIC 4 VELCRO ST LF (GAUZE/BANDAGES/DRESSINGS) ×2 IMPLANT
BANDAGE GAUZE ELAST BULKY 4 IN (GAUZE/BANDAGES/DRESSINGS) ×2 IMPLANT
BNDG CMPR 9X4 STRL LF SNTH (GAUZE/BANDAGES/DRESSINGS)
BNDG COHESIVE 1X5 TAN STRL LF (GAUZE/BANDAGES/DRESSINGS) IMPLANT
BNDG COHESIVE 4X5 TAN STRL (GAUZE/BANDAGES/DRESSINGS) ×1 IMPLANT
BNDG ESMARK 4X9 LF (GAUZE/BANDAGES/DRESSINGS) IMPLANT
CLOTH BEACON ORANGE TIMEOUT ST (SAFETY) ×2 IMPLANT
CORDS BIPOLAR (ELECTRODE) ×2 IMPLANT
COVER SURGICAL LIGHT HANDLE (MISCELLANEOUS) ×2 IMPLANT
DECANTER SPIKE VIAL GLASS SM (MISCELLANEOUS) ×1 IMPLANT
DRAIN PENROSE 1/4X12 LTX STRL (WOUND CARE) IMPLANT
DRSG ADAPTIC 3X8 NADH LF (GAUZE/BANDAGES/DRESSINGS) IMPLANT
DRSG EMULSION OIL 3X3 NADH (GAUZE/BANDAGES/DRESSINGS) ×1 IMPLANT
DRSG PAD ABDOMINAL 8X10 ST (GAUZE/BANDAGES/DRESSINGS) ×3 IMPLANT
GAUZE PACKING IODOFORM 1/2 (PACKING) ×1 IMPLANT
GAUZE XEROFORM 1X8 LF (GAUZE/BANDAGES/DRESSINGS) ×1 IMPLANT
GAUZE XEROFORM 5X9 LF (GAUZE/BANDAGES/DRESSINGS) ×1 IMPLANT
GLOVE BIO SURGEON STRL SZ7.5 (GLOVE) ×2 IMPLANT
GLOVE BIOGEL PI IND STRL 8 (GLOVE) ×1 IMPLANT
GLOVE BIOGEL PI INDICATOR 8 (GLOVE) ×1
GOWN STRL REIN XL XLG (GOWN DISPOSABLE) ×2 IMPLANT
HANDPIECE INTERPULSE COAX TIP (DISPOSABLE)
KIT BASIN OR (CUSTOM PROCEDURE TRAY) ×2 IMPLANT
KIT ROOM TURNOVER OR (KITS) ×2 IMPLANT
LOOP VESSEL MAXI BLUE (MISCELLANEOUS) IMPLANT
LOOP VESSEL MINI RED (MISCELLANEOUS) IMPLANT
MANIFOLD NEPTUNE II (INSTRUMENTS) ×2 IMPLANT
NDL HYPO 25X1 1.5 SAFETY (NEEDLE) IMPLANT
NEEDLE HYPO 25X1 1.5 SAFETY (NEEDLE) IMPLANT
NS IRRIG 1000ML POUR BTL (IV SOLUTION) ×2 IMPLANT
PACK ORTHO EXTREMITY (CUSTOM PROCEDURE TRAY) ×2 IMPLANT
PAD ARMBOARD 7.5X6 YLW CONV (MISCELLANEOUS) ×3 IMPLANT
SCRUB BETADINE 4OZ XXX (MISCELLANEOUS) ×1 IMPLANT
SET HNDPC FAN SPRY TIP SCT (DISPOSABLE) IMPLANT
SOLUTION BETADINE 4OZ (MISCELLANEOUS) ×2 IMPLANT
SPLINT FIBERGLASS 3X35 (CAST SUPPLIES) ×1 IMPLANT
SPONGE GAUZE 4X4 12PLY (GAUZE/BANDAGES/DRESSINGS) ×2 IMPLANT
SPONGE LAP 18X18 X RAY DECT (DISPOSABLE) ×2 IMPLANT
SPONGE LAP 4X18 X RAY DECT (DISPOSABLE) ×1 IMPLANT
SUCTION FRAZIER TIP 10 FR DISP (SUCTIONS) ×2 IMPLANT
SUT ETHILON 4 0 PS 2 18 (SUTURE) ×1 IMPLANT
SUT MON AB 5-0 P3 18 (SUTURE) IMPLANT
SYR CONTROL 10ML LL (SYRINGE) IMPLANT
TOWEL OR 17X24 6PK STRL BLUE (TOWEL DISPOSABLE) ×2 IMPLANT
TOWEL OR 17X26 10 PK STRL BLUE (TOWEL DISPOSABLE) ×2 IMPLANT
TUBE ANAEROBIC SPECIMEN COL (MISCELLANEOUS) IMPLANT
TUBE CONNECTING 12X1/4 (SUCTIONS) ×2 IMPLANT
TUBE FEEDING 5FR 15 INCH (TUBING) IMPLANT
UNDERPAD 30X30 INCONTINENT (UNDERPADS AND DIAPERS) ×2 IMPLANT
WATER STERILE IRR 1000ML POUR (IV SOLUTION) ×1 IMPLANT
YANKAUER SUCT BULB TIP NO VENT (SUCTIONS) ×1 IMPLANT

## 2011-09-16 NOTE — Op Note (Signed)
Dictation [phone]

## 2011-09-16 NOTE — Consult Note (Signed)
Austin [name] is an 36 y.o. male.   Chief Complaint: left hand infection HPI: 36 yo male with CP and noncommunicative.  Present with family member.  Reportedly began to have swelling and erythema of left hand two days ago.  No known wounds, but patient does put his hand in his mouth frequently.  Lives at home with family and caretaker.  Past Medical History  Diagnosis Date  . Nonspecific elevation of levels of transaminase and lactic acid dehydrogenase (LDH)   . Seizure disorder   . Cerebral palsy     History reviewed. No pertinent past surgical history.  No family history on file. Social History:  reports that he has never smoked. He does not have any smokeless tobacco history on file. He reports that he does not drink alcohol or use illicit drugs.  Allergies: No Known Allergies  Medications Prior to Admission  Medication Dose Route Frequency Provider Last Rate Last Dose  . 0.9 %  sodium chloride infusion   Intravenous STAT Lisette Paz, PA-C      . clindamycin (CLEOCIN) IVPB 600 mg  600 mg Intravenous Once [employer], PA-C   600 mg at 09/15/11 2144  . clindamycin (CLEOCIN) IVPB 900 mg  900 mg Intravenous Q8H Houston Siren, MD      . LORazepam (ATIVAN) injection 1 mg  1 mg Intravenous Q4H Lisette Paz, PA-C   1 mg at 09/16/11 0032  . morphine 2 MG/ML injection 2 mg  2 mg Intravenous Once Lisette Paz, PA-C   2 mg at 09/16/11 0123  . ondansetron (ZOFRAN) injection 4 mg  4 mg Intravenous PRN Lisette Paz, PA-C      . sodium chloride 0.9 % bolus 1,000 mL  1,000 mL Intravenous Once Lisette Paz, PA-C   1,000 mL at 09/15/11 2144   No current outpatient prescriptions on file as of 09/15/2011.    Results for orders placed during the hospital encounter of 09/15/11 (from the past 48 hour(s))  CBC     Status: Abnormal   Collection Time   09/15/11 10:44 PM      Component Value Range Comment   WBC 25.8 (*) 4.0 - 10.5 (K/uL)    RBC 5.09  4.22 - 5.81 (MIL/uL)    Hemoglobin 13.5  13.0 - 17.0 (g/dL)     HCT 16.1  09.6 - 04.5 (%)    MCV 77.0 (*) 78.0 - 100.0 (fL)    MCH 26.5  26.0 - 34.0 (pg)    MCHC 34.4  30.0 - 36.0 (g/dL)    RDW 40.9  81.1 - 91.4 (%)    Platelets 201  150 - 400 (K/uL)   DIFFERENTIAL     Status: Abnormal   Collection Time   09/15/11 10:44 PM      Component Value Range Comment   Neutrophils Relative 88 (*) 43 - 77 (%)    Lymphocytes Relative 3 (*) 12 - 46 (%)    Monocytes Relative 9  3 - 12 (%)    Eosinophils Relative 0  0 - 5 (%)    Basophils Relative 0  0 - 1 (%)    Neutro Abs 22.7 (*) 1.7 - 7.7 (K/uL)    Lymphs Abs 0.8  0.7 - 4.0 (K/uL)    Monocytes Absolute 2.3 (*) 0.1 - 1.0 (K/uL)    Eosinophils Absolute 0.0  0.0 - 0.7 (K/uL)    Basophils Absolute 0.0  0.0 - 0.1 (K/uL)    WBC Morphology TOXIC GRANULATION  BASIC METABOLIC PANEL     Status: Abnormal   Collection Time   09/15/11 10:44 PM      Component Value Range Comment   Sodium 139  135 - 145 (mEq/L)    Potassium 3.7  3.5 - 5.1 (mEq/L)    Chloride 104  96 - 112 (mEq/L)    CO2 22  19 - 32 (mEq/L)    Glucose, Bld 126 (*) 70 - 99 (mg/dL)    BUN 13  6 - 23 (mg/dL)    Creatinine, Ser 3.32  0.50 - 1.35 (mg/dL)    Calcium 9.3  8.4 - 10.5 (mg/dL)    GFR calc non Af Amer >90  >90 (mL/min)    GFR calc Af Amer >90  >90 (mL/min)     Dg Hand Complete Left  09/15/2011  *RADIOLOGY REPORT*  Clinical Data: Severe left hand swelling and infection.  Evaluate for osteomyelitis.  LEFT HAND - COMPLETE 3+ VIEW  Comparison: None.  Findings: Soft tissue swelling is seen involving the palm and proximal index finger.  No evidence of bone destruction, lucency, or periosteal reaction.  No evidence of fracture or dislocation. No evidence of arthropathy.  No evidence of soft tissue gas or radiopaque foreign body.  IMPRESSION: Soft tissue swelling involving the palmar and proximal index finger.  No osseous abnormality.  Original Report Authenticated By: Danae Orleans, M.D.     Review of systems not obtained due to patient  factors.  Blood pressure 130/100, pulse 120, temperature 98.4 F (36.9 C), temperature source Axillary, resp. rate 22, SpO2 98.00%.  General appearance: alert, appears stated age and slowed mentation Extremities: RUE: intact capillary refill.  moves fingers.  spastic.  LUE: swollen in hand.  thumb with active drainage dorsally.  especially ttp in thumb.  some ttp volar and dorsal hand.  erythema dorsally. Pulses: 2+ and symmetric Skin: as above Neurologic: Grossly normal   Assessment/Plan Left hand infection.  Thumb with active drainage and skin changes.  Difficult to determine exactly where abscess extent is due to poor cooperation and nonverbal nature of patient.  Recommend OR for I&D of left hand to include left thumb and possibly dorsum and volar surface of hand or other digits.  Risks, benefits, and alternatives of surgery were discussed and the patients family agrees with the plan of care.   Chiquitta Matty R 09/16/2011, 2:19 AM

## 2011-09-16 NOTE — Transfer of Care (Signed)
Immediate Anesthesia Transfer of Care Note  Patient: Austin [name]  Procedure(s) Performed: Procedure(s) (LRB): IRRIGATION AND DEBRIDEMENT EXTREMITY (Left)  Patient Location: PACU  Anesthesia Type: General  Level of Consciousness: awake  Airway & Oxygen Therapy: Patient Spontanous Breathing and Patient connected to face mask oxygen  Post-op Assessment: Report given to PACU RN, Post -op Vital signs reviewed and stable and Patient moving all extremities  Post vital signs: Reviewed and stable  Complications: No apparent anesthesia complications

## 2011-09-16 NOTE — Preoperative (Signed)
Beta Blockers   Reason not to administer Beta Blockers:Not Applicable 

## 2011-09-16 NOTE — Progress Notes (Signed)
ANTIBIOTIC CONSULT NOTE - INITIAL  Pharmacy Consult for Vancomycin Indication: L hand infection  No Known Allergies  Patient Measurements: Height: 5\' 6"  (167.6 cm) Weight: 115 lb (52.164 kg) IBW/kg (Calculated) : 63.8   Vital Signs: Temp: 98.8 F (37.1 C) (03/23 0512) Temp src: Axillary (03/22 1820) BP: 132/90 mmHg (03/23 0512) Pulse Rate: 115  (03/23 0512) Intake/Output from previous day: 03/22 0701 - 03/23 0700 In: 2200 [I.V.:2200] Out: 15 [Blood:15] Intake/Output from this shift: Total I/O In: 2200 [I.V.:2200] Out: 15 [Blood:15]  Labs:  [facility] 09/15/11 2244  WBC 25.8*  HGB 13.5  PLT 201  LABCREA --  CREATININE 0.91   Estimated Creatinine Clearance: 82.1 ml/min (by C-G formula based on Cr of 0.91).  Medical History: Past Medical History  Diagnosis Date  . Nonspecific elevation of levels of transaminase and lactic acid dehydrogenase (LDH)   . Seizure disorder   . Cerebral palsy     Medications:  Prescriptions prior to admission  Medication Sig Dispense Refill  . carbamazepine (CARBATROL) 200 MG 12 hr capsule Take 200 mg by mouth daily.       Assessment: 36 yo male  L hand I & D for empiric antibiotics  Goal of Therapy:  Vancomycin trough level 10-15 mcg/ml  Plan:  Vancomycin 1 g IV q12h  Eddie Candle 09/16/2011,6:09 AM

## 2011-09-16 NOTE — ED Provider Notes (Signed)
Medical screening examination/treatment/procedure(s) were performed by non-physician practitioner and as supervising physician I was immediately available for consultation/collaboration.   Shelda Jakes, MD 09/16/11 [phone]

## 2011-09-16 NOTE — Anesthesia Postprocedure Evaluation (Signed)
Anesthesia Post Note  Patient: Austin [name]  Procedure(s) Performed: Procedure(s) (LRB): IRRIGATION AND DEBRIDEMENT EXTREMITY (Left)  Anesthesia type: General  Patient location: PACU  Post pain: Pain level controlled and Adequate analgesia  Post assessment: Post-op Vital signs reviewed, Patient's Cardiovascular Status Stable, Respiratory Function Stable, Patent Airway and Pain level controlled  Last Vitals:  Filed Vitals:   09/16/11 0436  BP: 117/90  Pulse: 113  Temp: 37.1 C  Resp: 20    Post vital signs: Reviewed and stable  Level of consciousness: awake, alert  and oriented  Complications: No apparent anesthesia complications

## 2011-09-16 NOTE — Brief Op Note (Signed)
09/15/2011 - 09/16/2011  4:24 AM  PATIENT:  Thompson Caul  36 y.o. male  PRE-OPERATIVE DIAGNOSIS:  Left Hand Infection  POST-OPERATIVE DIAGNOSIS:  Left Hand Infection  PROCEDURE:  Procedure(s) (LRB): IRRIGATION AND DEBRIDEMENT EXTREMITY (Left)  SURGEON:  Surgeon(s) and Role:    * Tami Ribas, MD - Primary  PHYSICIAN ASSISTANT:   ASSISTANTS: none   ANESTHESIA:   general  EBL:  Total I/O In: 1000 [I.V.:1000] Out: -   BLOOD ADMINISTERED:none  DRAINS: iodoform packing  LOCAL MEDICATIONS USED:  MARCAINE     SPECIMEN:  Source of Specimen:  left hand  DISPOSITION OF SPECIMEN:  micro  COUNTS:  YES  TOURNIQUET:   Total Tourniquet Time Documented: Upper Arm (Left) - 64 minutes  DICTATION: .Other Dictation: Dictation Number [id number]  PLAN OF CARE: Admit to inpatient   PATIENT DISPOSITION:  PACU - hemodynamically stable.

## 2011-09-16 NOTE — Progress Notes (Signed)
Subjective: Day of Surgery Procedure(s) (LRB): IRRIGATION AND DEBRIDEMENT EXTREMITY (Left) Patient reports pain as sleeping.    Objective: Vital signs in last 24 hours: Temp:  [98.4 F (36.9 C)-98.8 F (37.1 C)] 98.8 F (37.1 C) (03/23 0512) Pulse Rate:  [106-120] 115  (03/23 0512) Resp:  [18-22] 18  (03/23 0512) BP: (117-132)/(90-100) 132/90 mmHg (03/23 0512) SpO2:  [96 %-100 %] 100 % (03/23 0512) Weight:  [52.164 kg (115 lb)] 52.164 kg (115 lb) (03/23 0512)  Intake/Output from previous day: 03/22 0701 - 03/23 0700 In: 2200 [I.V.:2200] Out: 15 [Blood:15] Intake/Output this shift:     Basename 09/16/11 0920 09/15/11 2244  HGB 12.6* 13.5    Basename 09/16/11 0920 09/15/11 2244  WBC 22.0* 25.8*  RBC 4.72 5.09  HCT 36.1* 39.2  PLT 181 201    Basename 09/16/11 0920 09/15/11 2244  NA -- 139  K -- 3.7  CL -- 104  CO2 -- 22  BUN -- 13  CREATININE 0.83 0.91  GLUCOSE -- 126*  CALCIUM -- 9.3   No results found for this basename: LABPT:2,INR:2 in the last 72 hours  brisk capillary refill all digits.  dressing clean dry intact. wiggles fingers.  Assessment/Plan: Day of Surgery Procedure(s) (LRB): IRRIGATION AND DEBRIDEMENT EXTREMITY (Left) Afebrile.  WBC improving.  Plan return to OR tomorrow for repeat I&D left hand.  Mitali Shenefield R 09/16/2011, 12:39 PM

## 2011-09-16 NOTE — Anesthesia Procedure Notes (Signed)
Procedure Name: Intubation Date/Time: 09/16/2011 3:08 AM Performed by: Rossie Muskrat L Pre-anesthesia Checklist: Patient identified, Timeout performed, Emergency Drugs available, Suction available and Patient being monitored Patient Re-evaluated:Patient Re-evaluated prior to inductionOxygen Delivery Method: Circle system utilized Preoxygenation: Pre-oxygenation with 100% oxygen Intubation Type: IV induction Ventilation: Mask ventilation without difficulty Laryngoscope Size: Miller and 2 Grade View: Grade I Tube type: Oral Tube size: 7.5 mm Number of attempts: 1 Airway Equipment and Method: Stylet Placement Confirmation: ETT inserted through vocal cords under direct vision,  breath sounds checked- equal and bilateral and positive ETCO2 Secured at: 21 cm Tube secured with: Tape Dental Injury: Teeth and Oropharynx as per pre-operative assessment

## 2011-09-16 NOTE — H&P (Signed)
PCP:   Janalyn Harder, MD, MD   Chief Complaint: Hand cellulitis.   HPI: Austin [name] is an 36 y.o. male with history of cerebral palsy, nonverbal, left lower leg paralysis, brought into the emergency room for left hand cellulitis. He stayed home and care for by his sisters with help from a home health aid as well. His sister was out of town, and when she returned she noted the swelling of his hand. He has a leukocytosis with a white count of 25,000. His blood glucose is 1.6 and his creatinine is normal. He also has history of seizure, and has been on Tegretol. Hand surgeon has seen him in emergency room, and reportedly, will be taken to the OR for debridement and I&D. He was started on IV clindamycin.  Rewiew of Systems: He is nonverbal.    Past Medical History  Diagnosis Date  . Nonspecific elevation of levels of transaminase and lactic acid dehydrogenase (LDH)   . Seizure disorder   . Cerebral palsy     History reviewed. No pertinent past surgical history.  Medications:  HOME MEDS: Prior to Admission medications   Medication Sig Start Date End Date Taking? Authorizing Provider  carbamazepine (CARBATROL) 200 MG 12 hr capsule Take 200 mg by mouth daily.   Yes Historical Provider, MD     Allergies:  No Known Allergies  Social History:   reports that he has never smoked. He does not have any smokeless tobacco history on file. He reports that he does not drink alcohol or use illicit drugs.  Family History: No family history on file.   Physical Exam: Filed Vitals:   09/15/11 1820 09/15/11 2333  BP: 123/98 130/100  Pulse: 106 120  Temp: 98.4 F (36.9 C)   TempSrc: Axillary   Resp: 18 22  SpO2: 98% 98%   Blood pressure 130/100, pulse 120, temperature 98.4 F (36.9 C), temperature source Axillary, resp. rate 22, SpO2 98.00%.  GEN:  Pleasant  person lying in the stretcher in no acute distress; nonverbal, noncommunicative, rocking himself on his wheelchair  PSYCH:   does not appear anxious does not appear depressed; affect is normal HEENT: Mucous membranes pink and anicteric; PERRLA; EOM intact; no cervical lymphadenopathy nor thyromegaly or carotid bruit; no JVD; Breasts:: Not examined CHEST WALL: No tenderness CHEST: Normal respiration, clear to auscultation bilaterally HEART: Regular rate and rhythm; no murmurs rubs or gallops BACK: No kyphosis or scoliosis; no CVA tenderness ABDOMEN: Obese, soft non-tender; no masses, no organomegaly, normal abdominal bowel sounds; no pannus; no intertriginous candida. Rectal Exam: Not done EXTREMITIES: Left hand swollen with purulent discharge, erythematous and tender. Left lower extremity is paralyzed. No edema on the right lower extremity. Genitalia: not examined PULSES: 2+ and symmetric SKIN: Alopecia CNS: Unable to evaluate.   Labs & Imaging Results for orders placed during the hospital encounter of 09/15/11 (from the past 48 hour(s))  CBC     Status: Abnormal   Collection Time   09/15/11 10:44 PM      Component Value Range Comment   WBC 25.8 (*) 4.0 - 10.5 (K/uL)    RBC 5.09  4.22 - 5.81 (MIL/uL)    Hemoglobin 13.5  13.0 - 17.0 (g/dL)    HCT 44.0  10.2 - 72.5 (%)    MCV 77.0 (*) 78.0 - 100.0 (fL)    MCH 26.5  26.0 - 34.0 (pg)    MCHC 34.4  30.0 - 36.0 (g/dL)    RDW 36.6  44.0 - 34.7 (%)  Platelets 201  150 - 400 (K/uL)   DIFFERENTIAL     Status: Abnormal   Collection Time   09/15/11 10:44 PM      Component Value Range Comment   Neutrophils Relative 88 (*) 43 - 77 (%)    Lymphocytes Relative 3 (*) 12 - 46 (%)    Monocytes Relative 9  3 - 12 (%)    Eosinophils Relative 0  0 - 5 (%)    Basophils Relative 0  0 - 1 (%)    Neutro Abs 22.7 (*) 1.7 - 7.7 (K/uL)    Lymphs Abs 0.8  0.7 - 4.0 (K/uL)    Monocytes Absolute 2.3 (*) 0.1 - 1.0 (K/uL)    Eosinophils Absolute 0.0  0.0 - 0.7 (K/uL)    Basophils Absolute 0.0  0.0 - 0.1 (K/uL)    WBC Morphology TOXIC GRANULATION     BASIC METABOLIC PANEL      Status: Abnormal   Collection Time   09/15/11 10:44 PM      Component Value Range Comment   Sodium 139  135 - 145 (mEq/L)    Potassium 3.7  3.5 - 5.1 (mEq/L)    Chloride 104  96 - 112 (mEq/L)    CO2 22  19 - 32 (mEq/L)    Glucose, Bld 126 (*) 70 - 99 (mg/dL)    BUN 13  6 - 23 (mg/dL)    Creatinine, Ser 1.61  0.50 - 1.35 (mg/dL)    Calcium 9.3  8.4 - 10.5 (mg/dL)    GFR calc non Af Amer >90  >90 (mL/min)    GFR calc Af Amer >90  >90 (mL/min)    Dg Hand Complete Left  09/15/2011  *RADIOLOGY REPORT*  Clinical Data: Severe left hand swelling and infection.  Evaluate for osteomyelitis.  LEFT HAND - COMPLETE 3+ VIEW  Comparison: None.  Findings: Soft tissue swelling is seen involving the palm and proximal index finger.  No evidence of bone destruction, lucency, or periosteal reaction.  No evidence of fracture or dislocation. No evidence of arthropathy.  No evidence of soft tissue gas or radiopaque foreign body.  IMPRESSION: Soft tissue swelling involving the palmar and proximal index finger.  No osseous abnormality.  Original Report Authenticated By: Danae Orleans, M.D.      Assessment Present on Admission:  .Swelling of limb .SEIZURE DISORDER .Furunculosis .CEREBRAL PALSY .Alopecia   PLAN: Cellulitis of the hand. He will go to surgery shortly. He was given IV clindamycin, I will continue and add vancomycin as well. We'll continue his Tegretol and check a level. He will need IV fluid. He is stable, full code, and will be admitted to triad hospitalist service.   Other plans as per orders.    Mella Inclan 09/16/2011, 1:25 AM

## 2011-09-16 NOTE — Progress Notes (Signed)
Subjective:   Chart reviewed. Patient nonverbal. Being bathed by nursing techs and appeared comfortable.  Objective  Vital signs in last 24 hours: Filed Vitals:   09/16/11 0436 09/16/11 0445 09/16/11 0500 09/16/11 0512  BP: 117/90 128/97 126/96 132/90  Pulse: 113 116 112 115  Temp: 98.7 F (37.1 C)  98.8 F (37.1 C) 98.8 F (37.1 C)  TempSrc:      Resp: 20 22 20 18   Height:    5\' 6"  (1.676 m)  Weight:    52.164 kg (115 lb)  SpO2: 96% 98% 98% 100%   Weight change:   Intake/Output Summary (Last 24 hours) at 09/16/11 1300 Last data filed at 09/16/11 0455  Gross per 24 hour  Intake   2200 ml  Output     15 ml  Net   2185 ml    Physical Exam:  General Exam: Comfortable.  Head: Large area of alopecia on the occipital scalp, said to be secondary to rocking motion and rubbing of his back of his head to the chair. No acute findings. Respiratory System: Clear. No increased work of breathing.  Cardiovascular System: First and second heart sounds heard. Regular rate and rhythm. No JVD/murmurs.  Gastrointestinal System: Abdomen is non distended, soft and normal bowel sounds heard.  Central Nervous System: Alert and nonverbal hence cannot assess orientation and he does not follow commands. No focal neurological deficits. Extremities: Left forearm and hand are dressed and the dressing appears clean and dry. Fingers appear to have normal color and good capillary refill. Right lower extremity contracture.  Labs:  Basic Metabolic Panel:  Lab 09/16/11 1610 09/15/11 2244  NA -- 139  K -- 3.7  CL -- 104  CO2 -- 22  GLUCOSE -- 126*  BUN -- 13  CREATININE 0.83 0.91  CALCIUM -- 9.3  ALB -- --  PHOS -- --   Liver Function Tests: No results found for this basename: AST:3,ALT:3,ALKPHOS:3,BILITOT:3,PROT:3,ALBUMIN:3 in the last 168 hours No results found for this basename: LIPASE:3,AMYLASE:3 in the last 168 hours No results found for this basename: AMMONIA:3 in the last 168  hours CBC:  Lab 09/16/11 0920 09/15/11 2244  WBC 22.0* 25.8*  NEUTROABS -- 22.7*  HGB 12.6* 13.5  HCT 36.1* 39.2  MCV 76.5* 77.0*  PLT 181 201   Cardiac Enzymes: No results found for this basename: CKTOTAL:5,CKMB:5,CKMBINDEX:5,TROPONINI:5 in the last 168 hours CBG: No results found for this basename: GLUCAP:5 in the last 168 hours  Iron Studies: No results found for this basename: IRON,TIBC,TRANSFERRIN,FERRITIN in the last 72 hours Studies/Results: Dg Hand Complete Left  09/15/2011  *RADIOLOGY REPORT*  Clinical Data: Severe left hand swelling and infection.  Evaluate for osteomyelitis.  LEFT HAND - COMPLETE 3+ VIEW  Comparison: None.  Findings: Soft tissue swelling is seen involving the palm and proximal index finger.  No evidence of bone destruction, lucency, or periosteal reaction.  No evidence of fracture or dislocation. No evidence of arthropathy.  No evidence of soft tissue gas or radiopaque foreign body.  IMPRESSION: Soft tissue swelling involving the palmar and proximal index finger.  No osseous abnormality.  Original Report Authenticated By: Danae Orleans, M.D.   Medications:    . sodium chloride 150 mL/hr at 09/16/11 0651      . sodium chloride   Intravenous STAT  . carbamazepine  200 mg Oral Daily  . clindamycin (CLEOCIN) IV  600 mg Intravenous Once  . heparin  5,000 Units Subcutaneous Q8H  . LORazepam  1 mg  Intravenous Q4H  .  morphine injection  2 mg Intravenous Once  . piperacillin-tazobactam  3.375 g Intravenous Q8H  . sodium chloride  1,000 mL Intravenous Once  . vancomycin  1,000 mg Intravenous BID  . DISCONTD: carbamazepine  200 mg Oral Daily  . DISCONTD: clindamycin (CLEOCIN) IV  900 mg Intravenous Q8H    I  have reviewed scheduled and prn medications.     Problem/Plan: Active Problems:  CEREBRAL PALSY  Swelling of limb  SEIZURE DISORDER  Alopecia  Furunculosis  1. Left hand abscess and cellulitis: Status post irrigation and debridement on  09/16/2011. Continue intravenous vancomycin and Zosyn. Management per hand surgery. 2. Leukocytosis: Secondary to problem #1. Follow daily CBCs. 3. Mild anemia: Follow daily CBCs. 4. Cerebral palsy with associated mental retardation: Mental status probably at baseline. 5. History of seizure disorder: Continue Tegretol. No reported seizures in hospital.  Austin [name] 09/16/2011,1:00 PM  LOS: 1 day

## 2011-09-16 NOTE — Op Note (Addendum)
[name], [name] NO.:  [id number]  MEDICAL RECORD NO.:  [id number]  LOCATION:  [facility]                         FACILITY:  [facility]  PHYSICIAN:  Betha Loa, MD        DATE OF BIRTH:  [birth year]  DATE OF PROCEDURE:  09/16/2011 DATE OF DISCHARGE:                              OPERATIVE REPORT   PREOPERATIVE DIAGNOSIS:  Left hand infection.  POSTOP DIAGNOSIS:  Left hand and thenar space infection.  PROCEDURE:  Irrigation and debridement of left hand thenar space.  SURGEON:  Betha Loa, MD  ASSISTANTS:  None.  ANESTHESIA:  General.  IV FLUIDS:  Per anesthesia flow sheet.  ESTIMATED BLOOD LOSS:  Minimal.  COMPLICATIONS:  None.  SPECIMENS:  Cultures from left hand to micro.  TOURNIQUET TIME:  64 minutes.  DISPOSITION:  Stable to PACU.  INDICATIONS:  [name] is a 36 year old male who is nonverbal and has CP.  He was brought to the emergency room by family members.  They state that over the past couple of days, they have noticed that his left hand  becoming more swollen, erythematous and painful.  On examination, he had purulence coming from the dorsum of the left thumb.  There was swelling in the entire hand as well as some erythema.  There was streaking going up the arm.  It was tender to palpation.  It was difficult to determine any other areas that may have abscess secondary to his nonverbal nature.  I recommended going to the operating room for irrigation and debridement of the hand.  Risks, benefits, and alternatives of surgery were discussed including the risk of blood loss, infection, damage to nerves, vessels, tendons, ligaments, bone, fair surgery, need for additional surgery, complications with wound healing, continued pain, continued infection, need for repeat irrigation and debridement, and possible need for amputation.  They voiced understanding of these risks and elected to proceed.  OPERATIVE COURSE:  After being identified  preoperatively by myself, the patient's family and I agreed upon procedure and site procedure.  The surgical site was marked.  The risks, benefits, alternatives of surgery were reviewed and they wished to proceed.  Surgical consent had been Signed by his sister.  He was transferred to the operating room and  placed on the operative room table in supine position with the left upper extremity on arm board. General anesthesia was induced by the anesthesiologist.  The left upper extremity prepped and draped in normal sterile orthopedic fashion. Surgical pause was performed between surgeons, anesthesia, operating staff, and all are in agreement with the patient, procedure, and site of procedure.  Tourniquet proximal aspect of the extremity was inflated to 250 mmHg after gravity exsanguination of the limb.  An incision was made on the dorsum of the thumb.  There was gross purulence in the subcutaneous tissues.  This was cultured for both aerobes, anaerobes, AFB, and fungus.  The skin edges that were necrotic in two areas where there was active drainage were sharply debrided.  The purulence coursed down toward the base of the metacarpal.  Skin incision was extended until normal tissue was encountered.  The infection also coursed around the ulnar side of the thumb  to the volar tissues over the proximal phalanx.  Also coursed into the thenar eminence deep to the thenar musculature.  There was significant amount of purulence within this area.  Incision was made on the volar aspect of the hand.  There was no gross purulence within the subcutaneous tissues Volarly.  This was made to communicate with the tissues deep to the thenar eminence where the purulence was.  An additional incision was made on the dorsum of the hand were there were significant boggy swelling.  This was carried into the subcutaneous tissues by spreading technique.  There is significant edema but no purulence in this area.  The  area underneath the tendons was examined and again there was significant edema but no purulence. All wounds were copiously irrigated with 6000 mL of sterile saline.  The rongeurs and Ray-Tec sponges had been used to debride the thumb wound of purulence.  It was felt that nearly all purulence had been removed. There were 2 small areas on the radial side of the distal thumb where there was a small amount of purulent tissue, but it did not form an abscess.  This was debrided as well.  The wounds after being copiously irrigated were packed with 1/2 inch Iodoform gauze.  They were dressed with sterile Xeroform.  A 10 mL of 0.25% plain Marcaine were used to aid in postoperative analgesia, which were dressed with sterile Xeroform, 4x4s, and ABD and wrapped with a Kerlix bandage.  A thumb spica splint was placed and wrapped with Kerlix and a Coban very lightly.  This is to try and prevent him from taking off his dressing.  The tourniquet was deflated at 64 minutes.  The fingertips were pink with brisk capillary refill after deflation of tourniquet.  Operative draping were broken down.  The patient was woken from anesthesia safely.  Transferred back to stretcher and taken to PACU stable condition.  He will be admitted for continued IV antibiotics.  It is possible he may need to return to the OR versus whirlpool therapy.  I will continue him on IV antibiotics and follow him clinically.     Betha Loa, MD     KK/MEDQ  D:  09/16/2011  T:  09/16/2011  Job:  [id number]  Addendum: The skin, subcutaneous tissues, and muscle were debrided by scraping and irrigation.  The skin was debrided sharply with a knife.  The skin, subcutaneous tissues, and muscle were debrided by scraping and irrigation to excise abscess and devitalized subcutaneous tissue.  The skin was debrided sharply with a knife and excised.

## 2011-09-16 NOTE — Anesthesia Preprocedure Evaluation (Signed)
Anesthesia Evaluation  Patient identified by MRN, date of birth, ID band Patient awake    Reviewed: Allergy & Precautions, H&P , NPO status , Patient's Chart, lab work & pertinent test results  Airway Mallampati: II  Neck ROM: full    Dental   Pulmonary          Cardiovascular     Neuro/Psych Seizures -,  Cerebral palsy    GI/Hepatic   Endo/Other    Renal/GU      Musculoskeletal   Abdominal   Peds  Hematology   Anesthesia Other Findings   Reproductive/Obstetrics                           Anesthesia Physical Anesthesia Plan  ASA: II  Anesthesia Plan: General   Post-op Pain Management:    Induction: Intravenous  Airway Management Planned: Oral ETT  Additional Equipment:   Intra-op Plan:   Post-operative Plan: Extubation in OR  Informed Consent: I have reviewed the patients History and Physical, chart, labs and discussed the procedure including the risks, benefits and alternatives for the proposed anesthesia with the patient or authorized representative who has indicated his/her understanding and acceptance.     Plan Discussed with: CRNA and Surgeon  Anesthesia Plan Comments:         Anesthesia Quick Evaluation

## 2011-09-17 ENCOUNTER — Encounter (HOSPITAL_COMMUNITY): Payer: Self-pay | Admitting: Anesthesiology

## 2011-09-17 ENCOUNTER — Encounter (HOSPITAL_COMMUNITY)
Admission: EM | Disposition: A | Discharge status: Home / Self Care | Payer: Self-pay | Source: Ambulatory Visit | Attending: Internal Medicine

## 2011-09-17 ENCOUNTER — Inpatient Hospital Stay (HOSPITAL_COMMUNITY): Payer: Medicare Other | Admitting: Anesthesiology

## 2011-09-17 HISTORY — PX: I & D EXTREMITY: SHX5045

## 2011-09-17 LAB — CBC
HCT: 33.2 % — ABNORMAL LOW (ref 39.0–52.0)
HCT: 34.5 % — ABNORMAL LOW (ref 39.0–52.0)
MCHC: 34.6 g/dL (ref 30.0–36.0)
MCHC: 35.1 g/dL (ref 30.0–36.0)
MCV: 76.8 fL — ABNORMAL LOW (ref 78.0–100.0)
Platelets: 184 10*3/uL (ref 150–400)
RDW: 13.2 % (ref 11.5–15.5)
RDW: 13.1 % (ref 11.5–15.5)

## 2011-09-17 LAB — BASIC METABOLIC PANEL
BUN: 12 mg/dL (ref 6–23)
Chloride: 107 mEq/L (ref 96–112)
Creatinine, Ser: 0.81 mg/dL (ref 0.50–1.35)
GFR calc Af Amer: >90 mL/min (ref >90)

## 2011-09-17 SURGERY — IRRIGATION AND DEBRIDEMENT EXTREMITY
Anesthesia: General | Site: Hand | Laterality: Left | Wound class: Dirty or Infected

## 2011-09-17 MED ORDER — SODIUM CHLORIDE 0.9 % IR SOLN
Status: DC | PRN
  Administered 2011-09-17: 6000 mL

## 2011-09-17 MED ORDER — MIDAZOLAM HCL 5 MG/5ML IJ SOLN
INTRAMUSCULAR | Status: DC | PRN
  Administered 2011-09-17: 2 mg via INTRAVENOUS

## 2011-09-17 MED ORDER — GLYCOPYRROLATE 0.2 MG/ML IJ SOLN
INTRAMUSCULAR | Status: DC | PRN
  Administered 2011-09-17: .6 mg via INTRAVENOUS

## 2011-09-17 MED ORDER — ROCURONIUM BROMIDE 100 MG/10ML IV SOLN
INTRAVENOUS | Status: DC | PRN
  Administered 2011-09-17: 40 mg via INTRAVENOUS

## 2011-09-17 MED ORDER — PROPOFOL 10 MG/ML IV EMUL
INTRAVENOUS | Status: DC | PRN
  Administered 2011-09-17: 170 mg via INTRAVENOUS

## 2011-09-17 MED ORDER — ONDANSETRON HCL 4 MG/2ML IJ SOLN
INTRAMUSCULAR | Status: DC | PRN
  Administered 2011-09-17: 4 mg via INTRAVENOUS

## 2011-09-17 MED ORDER — NEOSTIGMINE METHYLSULFATE 1 MG/ML IJ SOLN
INTRAMUSCULAR | Status: DC | PRN
  Administered 2011-09-17: 4 mg via INTRAVENOUS

## 2011-09-17 MED ORDER — ONDANSETRON HCL 4 MG/2ML IJ SOLN: 4.0000 mg | Freq: Four times a day (QID) | INTRAMUSCULAR | Status: DC | PRN

## 2011-09-17 MED ORDER — FENTANYL CITRATE 0.05 MG/ML IJ SOLN: 25.0000 ug | INTRAMUSCULAR | Status: DC | PRN

## 2011-09-17 MED ORDER — FENTANYL CITRATE 0.05 MG/ML IJ SOLN
INTRAMUSCULAR | Status: DC | PRN
  Administered 2011-09-17: 100 ug via INTRAVENOUS
  Administered 2011-09-17 (×2): 50 ug via INTRAVENOUS

## 2011-09-17 SURGICAL SUPPLY — 54 items
BANDAGE COBAN STERILE 2 (GAUZE/BANDAGES/DRESSINGS) IMPLANT
BANDAGE CONFORM 2  STR LF (GAUZE/BANDAGES/DRESSINGS) IMPLANT
BANDAGE ELASTIC 3 VELCRO ST LF (GAUZE/BANDAGES/DRESSINGS) ×2 IMPLANT
BANDAGE ELASTIC 4 VELCRO ST LF (GAUZE/BANDAGES/DRESSINGS) ×2 IMPLANT
BANDAGE GAUZE ELAST BULKY 4 IN (GAUZE/BANDAGES/DRESSINGS) ×2 IMPLANT
BNDG CMPR 9X4 STRL LF SNTH (GAUZE/BANDAGES/DRESSINGS)
BNDG COHESIVE 1X5 TAN STRL LF (GAUZE/BANDAGES/DRESSINGS) IMPLANT
BNDG ESMARK 4X9 LF (GAUZE/BANDAGES/DRESSINGS) IMPLANT
CLOTH BEACON ORANGE TIMEOUT ST (SAFETY) ×2 IMPLANT
CORDS BIPOLAR (ELECTRODE) ×2 IMPLANT
COVER SURGICAL LIGHT HANDLE (MISCELLANEOUS) ×2 IMPLANT
DECANTER SPIKE VIAL GLASS SM (MISCELLANEOUS) ×1 IMPLANT
DRAIN PENROSE 1/4X12 LTX STRL (WOUND CARE) IMPLANT
DRSG ADAPTIC 3X8 NADH LF (GAUZE/BANDAGES/DRESSINGS) IMPLANT
DRSG EMULSION OIL 3X3 NADH (GAUZE/BANDAGES/DRESSINGS) ×1 IMPLANT
DRSG PAD ABDOMINAL 8X10 ST (GAUZE/BANDAGES/DRESSINGS) ×2 IMPLANT
GAUZE PACKING IODOFORM 1/2 (PACKING) ×1 IMPLANT
GAUZE XEROFORM 1X8 LF (GAUZE/BANDAGES/DRESSINGS) ×3 IMPLANT
GLOVE BIO SURGEON STRL SZ7.5 (GLOVE) ×4 IMPLANT
GLOVE BIOGEL PI IND STRL 8 (GLOVE) ×1 IMPLANT
GLOVE BIOGEL PI INDICATOR 8 (GLOVE) ×1
GLOVE NEODERM STER SZ 7 (GLOVE) ×1 IMPLANT
GOWN STRL REIN XL XLG (GOWN DISPOSABLE) ×3 IMPLANT
HANDPIECE INTERPULSE COAX TIP (DISPOSABLE)
KIT BASIN OR (CUSTOM PROCEDURE TRAY) ×2 IMPLANT
KIT ROOM TURNOVER OR (KITS) ×2 IMPLANT
LOOP VESSEL MAXI BLUE (MISCELLANEOUS) IMPLANT
LOOP VESSEL MINI RED (MISCELLANEOUS) IMPLANT
MANIFOLD NEPTUNE II (INSTRUMENTS) ×2 IMPLANT
NDL HYPO 25X1 1.5 SAFETY (NEEDLE) IMPLANT
NEEDLE HYPO 25X1 1.5 SAFETY (NEEDLE) IMPLANT
NS IRRIG 1000ML POUR BTL (IV SOLUTION) ×2 IMPLANT
PACK ORTHO EXTREMITY (CUSTOM PROCEDURE TRAY) ×2 IMPLANT
PAD ARMBOARD 7.5X6 YLW CONV (MISCELLANEOUS) ×4 IMPLANT
SCRUB BETADINE 4OZ XXX (MISCELLANEOUS) ×2 IMPLANT
SET HNDPC FAN SPRY TIP SCT (DISPOSABLE) IMPLANT
SOLUTION BETADINE 4OZ (MISCELLANEOUS) ×2 IMPLANT
SPLINT PLASTER EXTRA FAST 3X15 (CAST SUPPLIES) ×1
SPLINT PLASTER GYPS XFAST 3X15 (CAST SUPPLIES) IMPLANT
SPONGE GAUZE 4X4 12PLY (GAUZE/BANDAGES/DRESSINGS) ×2 IMPLANT
SPONGE LAP 18X18 X RAY DECT (DISPOSABLE) ×1 IMPLANT
SPONGE LAP 4X18 X RAY DECT (DISPOSABLE) ×1 IMPLANT
SUCTION FRAZIER TIP 10 FR DISP (SUCTIONS) ×2 IMPLANT
SUT ETHILON 4 0 PS 2 18 (SUTURE) ×2 IMPLANT
SUT MON AB 5-0 P3 18 (SUTURE) ×1 IMPLANT
SYR CONTROL 10ML LL (SYRINGE) IMPLANT
TOWEL OR 17X24 6PK STRL BLUE (TOWEL DISPOSABLE) ×2 IMPLANT
TOWEL OR 17X26 10 PK STRL BLUE (TOWEL DISPOSABLE) ×2 IMPLANT
TUBE ANAEROBIC SPECIMEN COL (MISCELLANEOUS) IMPLANT
TUBE CONNECTING 12X1/4 (SUCTIONS) ×2 IMPLANT
TUBE FEEDING 5FR 15 INCH (TUBING) IMPLANT
UNDERPAD 30X30 INCONTINENT (UNDERPADS AND DIAPERS) ×2 IMPLANT
WATER STERILE IRR 1000ML POUR (IV SOLUTION) ×1 IMPLANT
YANKAUER SUCT BULB TIP NO VENT (SUCTIONS) ×1 IMPLANT

## 2011-09-17 NOTE — Anesthesia Preprocedure Evaluation (Signed)
Anesthesia Evaluation  Patient identified by MRN, date of birth, ID band  Reviewed: Allergy & Precautions, H&P , NPO status , Patient's Chart, lab work & pertinent test results  Airway Mallampati: II  Neck ROM: full    Dental   Pulmonary          Cardiovascular     Neuro/Psych Seizures -,  Cerebral palsy    GI/Hepatic   Endo/Other    Renal/GU      Musculoskeletal   Abdominal   Peds  Hematology   Anesthesia Other Findings   Reproductive/Obstetrics                           Anesthesia Physical Anesthesia Plan  ASA: II  Anesthesia Plan: General   Post-op Pain Management:    Induction: Intravenous  Airway Management Planned: Oral ETT  Additional Equipment:   Intra-op Plan:   Post-operative Plan: Extubation in OR  Informed Consent:   Plan Discussed with: CRNA and Surgeon  Anesthesia Plan Comments:         Anesthesia Quick Evaluation

## 2011-09-17 NOTE — Op Note (Signed)
Dictation [phone]

## 2011-09-17 NOTE — Progress Notes (Signed)
Subjective:   Patient had second irrigation and debridement of left hand this morning. Patient is nonverbal. According to nursing no acute events.  Objective  Vital signs in last 24 hours: Filed Vitals:   09/17/11 1130 09/17/11 1145 09/17/11 1209 09/17/11 1438  BP: 127/93 133/67 132/85 133/67  Pulse: 102 74  95  Temp:  97.4 F (36.3 C) 98 F (36.7 C) 98.8 F (37.1 C)  TempSrc:   Axillary Axillary  Resp: 16 19 16 18   Height:      Weight:      SpO2:  100% 99% 99%   Weight change:   Intake/Output Summary (Last 24 hours) at 09/17/11 1504 Last data filed at 09/17/11 1438  Gross per 24 hour  Intake   2595 ml  Output      0 ml  Net   2595 ml    Physical Exam:  General Exam: Comfortable. Eating lunch-being fed by nursing. Head: Large area of alopecia on the occipital scalp, said to be secondary to rocking motion and rubbing of his back of his head to the chair. No acute findings. Respiratory System: Clear. No increased work of breathing.  Cardiovascular System: First and second heart sounds heard. Regular rate and rhythm. No JVD/murmurs.  Gastrointestinal System: Abdomen is non distended, soft and normal bowel sounds heard.  Central Nervous System: Alert and nonverbal hence cannot assess orientation and he does not follow commands. No focal neurological deficits. Extremities: Left forearm and hand are dressed and the dressing appears clean and dry. Fingers appear to have normal color and good capillary refill. Right lower extremity contracture.  Labs:  Basic Metabolic Panel:  Lab 09/17/11 7829 09/16/11 0920 09/15/11 2244  NA 138 -- 139  K 3.7 -- 3.7  CL 107 -- 104  CO2 24 -- 22  GLUCOSE 115* -- 126*  BUN 12 -- 13  CREATININE 0.81 0.83 0.91  CALCIUM 8.2* -- 9.3  ALB -- -- --  PHOS -- -- --   Liver Function Tests: No results found for this basename: AST:3,ALT:3,ALKPHOS:3,BILITOT:3,PROT:3,ALBUMIN:3 in the last 168 hours No results found for this basename:  LIPASE:3,AMYLASE:3 in the last 168 hours No results found for this basename: AMMONIA:3 in the last 168 hours CBC:  Lab 09/17/11 0014 09/16/11 0920 09/15/11 2244  WBC 16.8* 22.0* 25.8*  NEUTROABS -- -- 22.7*  HGB 11.5* 12.6* 13.5  HCT 33.2* 36.1* 39.2  MCV 76.7* 76.5* 77.0*  PLT 197 181 201   Cardiac Enzymes: No results found for this basename: CKTOTAL:5,CKMB:5,CKMBINDEX:5,TROPONINI:5 in the last 168 hours CBG: No results found for this basename: GLUCAP:5 in the last 168 hours  Iron Studies: No results found for this basename: IRON,TIBC,TRANSFERRIN,FERRITIN in the last 72 hours Studies/Results: Dg Hand Complete Left  09/15/2011  *RADIOLOGY REPORT*  Clinical Data: Severe left hand swelling and infection.  Evaluate for osteomyelitis.  LEFT HAND - COMPLETE 3+ VIEW  Comparison: None.  Findings: Soft tissue swelling is seen involving the palm and proximal index finger.  No evidence of bone destruction, lucency, or periosteal reaction.  No evidence of fracture or dislocation. No evidence of arthropathy.  No evidence of soft tissue gas or radiopaque foreign body.  IMPRESSION: Soft tissue swelling involving the palmar and proximal index finger.  No osseous abnormality.  Original Report Authenticated By: Danae Orleans, M.D.   Medications:    . sodium chloride        . sodium chloride   Intravenous STAT  . carbamazepine  200 mg Oral Daily  .  heparin  5,000 Units Subcutaneous Q8H  . piperacillin-tazobactam  3.375 g Intravenous Q8H  . vancomycin  1,000 mg Intravenous BID    I  have reviewed scheduled and prn medications.     Problem/Plan: Active Problems:  CEREBRAL PALSY  Swelling of limb  SEIZURE DISORDER  Alopecia  Furunculosis  1. Left hand abscess and cellulitis: Status post irrigation and debridement on 09/16/2011 and 09/17/2011. Continue intravenous vancomycin and Zosyn. Management per hand surgery. Seems to be improving. 2. Leukocytosis: Secondary to problem #1.  Decreasing. 3. Mild anemia: Follow daily CBCs. 4. Cerebral palsy with associated mental retardation: Mental status probably at baseline. 5. History of seizure disorder: Continue Tegretol. No reported seizures in hospital.  [facility] 09/17/2011,3:04 PM  LOS: 2 days

## 2011-09-17 NOTE — Anesthesia Procedure Notes (Signed)
Procedure Name: Intubation Date/Time: 09/17/2011 9:55 AM Performed by: Jerilee Hoh Pre-anesthesia Checklist: Patient identified, Emergency Drugs available, Suction available and Patient being monitored Patient Re-evaluated:Patient Re-evaluated prior to inductionOxygen Delivery Method: Circle system utilized Preoxygenation: Pre-oxygenation with 100% oxygen Intubation Type: IV induction Ventilation: Mask ventilation without difficulty Laryngoscope Size: Mac and 4 Grade View: Grade II Tube type: Oral Tube size: 7.5 mm Number of attempts: 1 Airway Equipment and Method: Stylet Placement Confirmation: ETT inserted through vocal cords under direct vision,  positive ETCO2 and breath sounds checked- equal and bilateral Secured at: 22 cm Tube secured with: Tape Dental Injury: Teeth and Oropharynx as per pre-operative assessment

## 2011-09-17 NOTE — Anesthesia Postprocedure Evaluation (Signed)
Anesthesia Post Note  Patient: Austin [name]  Procedure(s) Performed: Procedure(s) (LRB): IRRIGATION AND DEBRIDEMENT EXTREMITY (Left)  Anesthesia type: General  Patient location: PACU  Post pain: Pain level controlled and Adequate analgesia  Post assessment: Post-op Vital signs reviewed, Patient's Cardiovascular Status Stable, Respiratory Function Stable, Patent Airway and Pain level controlled  Last Vitals:  Filed Vitals:   09/17/11 1145  BP: 133/67  Pulse: 74  Temp: 36.3 C  Resp: 19    Post vital signs: Reviewed and stable  Level of consciousness: awake, alert  and oriented  Complications: No apparent anesthesia complications

## 2011-09-17 NOTE — Preoperative (Signed)
Beta Blockers   Reason not to administer Beta Blockers:Not Applicable 

## 2011-09-17 NOTE — Op Note (Addendum)
NAMEFREEDOM, PEDDY NO.:  [id number]  MEDICAL RECORD NO.:  [id number]  LOCATION:  5017                         FACILITY:  [facility]  PHYSICIAN:  Betha Loa, MD        DATE OF BIRTH:  [birth year]  DATE OF PROCEDURE:  09/17/2011 DATE OF DISCHARGE:                              OPERATIVE REPORT   PREOPERATIVE DIAGNOSIS:  Left hand, status post irrigation and debridement of thenar space abscess.  POSTOPERATIVE DIAGNOSIS:  Left hand, status post irrigation and debridement of thenar space abscess.  PROCEDURE:  Irrigation and debridement, left hand.  SURGEON:  Betha Loa, MD  ASSISTANTS:  None..  ANESTHESIA:  General.  IV FLUIDS:  Per anesthesia flow sheet.  ESTIMATED BLOOD LOSS:  Minimal.  COMPLICATIONS:  None.  SPECIMENS:  None.  TOURNIQUET TIME:  35 minutes.  DISPOSITION:  Stable to PACU.  INDICATIONS:  Mr. Dorfman is a 36 year old right-hand dominant male who 2 days ago was taken to the operating room for irrigation and debridement of the left hand infection and thenar space abscess.  This was performed without complication.  The wound was packed open.  He has returned to the OR today for repeat irrigation and debridement and change packing.  His cultures have so far shown gram-positive cocci, gram-positive rods, and gram-negative rods.  He was afebrile and his white count was trending down.  Risks, benefits, and alternatives of the procedure were discussed with his sister who signs this consent.  Risks including blood loss; infection; damage to nerves, vessels, tendons, ligaments, bone; failure of surgery; need for additional surgery; complications; wound healing; continued pain; continued infection; need for repeat irrigation and debridement.  They voiced understanding of these risks and elected to proceed.  OPERATIVE COURSE:  After being identified preoperatively by myself, the procedure was confirmed.  The surgical site was marked.  The  risks, benefits, and alternatives of surgery had been reviewed and surgical consent had been signed.  The patient was transferred to the operating room and placed on the operating room table in supine position with left upper extremity on an arm board.  General anesthesia was induced by the anesthesiologist.  The left upper extremity was prepped and draped in normal sterile orthopedic fashion.  Surgical pause was performed between surgeons, anesthesia, and operating room staff and all were in agreement as to the patient, procedure, and site of procedure.  Packing had been removed prior to prepping and draping.  The wound was inspected.  There was no gross purulence.  There was no reaccumulation of the abscess. The skin edges appeared viable with the exception of the skin edge right at the web space where it was slightly thinned and more necrotic looking.  This skin was debrided sharply with scissors.  The tourniquet was inflated to 250 mmHg after exsanguination of the forearm with an Esmarch bandage.  The wound was copiously irrigated with 4000 mL of sterile saline.  An additional incision was made at the volar aspect of the thenar eminence where the wound course deep around the radial side of the thumb proximal phalanx. There was no gross purulence within this area.  This wound was able to be made to  communicate with the dorsal wound.  The abscess location in the thenar space was probed.  There was no gross purulence re- accumulating.  All wounds were debrided with Ray-Tec sponge.  The web space had been debrided sharply.  The wounds were again copiously irrigated with another 2000 mL of sterile saline.  All wounds were packed with 1/2-inch Iodoform gauze.  Two sutures were placed in the previous volar wound to decrease wound size at the proximal edge.  There was no apparent infectious process at this area.  This area was packed with Iodoform gauze.  The wounds were then dressed with  sterile Xeroform, 4x4s, and wrapped with a Kerlix bandage.  A thumb spica splint was placed and wrapped with Kerlix and Ace bandage.  The tourniquet was deflated at 35 minutes.  The operative drapes were broken down.  The patient was awoken from anesthesia safely.  He was transferred back to stretcher and taken to PACU in stable condition.  He will be continued on IV antibiotics at this time.  We will try hydrotherapy in 2 days for removal of packing and repacking of the wound if possible.     Betha Loa, MD     KK/MEDQ  D:  09/17/2011  T:  09/17/2011  Job:  [id number]  Sharp excisional debridement of the skin was performed with scissors.  The subcutaneous tissues were irrigated copiously, no tissue was excised from the subcutaneous tissue.

## 2011-09-17 NOTE — Transfer of Care (Signed)
Immediate Anesthesia Transfer of Care Note  Patient: Austin [name]  Procedure(s) Performed: Procedure(s) (LRB): IRRIGATION AND DEBRIDEMENT EXTREMITY (Left)  Patient Location: PACU  Anesthesia Type: General  Level of Consciousness: awake and alert   Airway & Oxygen Therapy: Patient Spontanous Breathing and Patient connected to nasal cannula oxygen  Post-op Assessment: Report given to PACU RN, Post -op Vital signs reviewed and stable and Patient moving all extremities  Post vital signs: Reviewed and stable  Complications: No apparent anesthesia complications

## 2011-09-17 NOTE — Brief Op Note (Signed)
09/15/2011 - 09/17/2011  10:57 AM  PATIENT:  Austin [name]  36 y.o. male  PRE-OPERATIVE DIAGNOSIS:  * No pre-op diagnosis entered *  POST-OPERATIVE DIAGNOSIS:  Left hand infection  PROCEDURE:  Procedure(s) (LRB): IRRIGATION AND DEBRIDEMENT EXTREMITY (Left)  SURGEON:  Surgeon(s) and Role:    * Tami Ribas, MD - Primary  PHYSICIAN ASSISTANT:   ASSISTANTS: none   ANESTHESIA:   general  EBL:  Total I/O In: 1000 [I.V.:1000] Out: -   BLOOD ADMINISTERED:none  DRAINS: iodoform packing  LOCAL MEDICATIONS USED:  NONE  SPECIMEN:  No Specimen  DISPOSITION OF SPECIMEN:  N/A  COUNTS:  YES  TOURNIQUET:  * Missing tourniquet times found for documented tourniquets in log:  16109 *  DICTATION: .Other Dictation: Dictation Number [id number]  PLAN OF CARE: Admit to inpatient   PATIENT DISPOSITION:  PACU - hemodynamically stable.

## 2011-09-17 NOTE — Progress Notes (Signed)
Subjective:   Procedure(s) (LRB): IRRIGATION AND DEBRIDEMENT EXTREMITY (Left) Patient reports pain as No apparent discomfort..    Objective: Vital signs in last 24 hours: Temp:  [97.5 F (36.4 C)-99 F (37.2 C)] 97.5 F (36.4 C) (03/24 0546) Pulse Rate:  [99-115] 99  (03/24 0546) Resp:  [18-20] 20  (03/24 0546) BP: (120-138)/(67-85) 120/81 mmHg (03/24 0546) SpO2:  [98 %-100 %] 98 % (03/24 0546)  Intake/Output from previous day: 03/23 0701 - 03/24 0700 In: 2530 [P.O.:720; I.V.:1510; IV Piggyback:300] Out: -  Intake/Output this shift:     Basename 09/17/11 0014 09/16/11 0920 09/15/11 2244  HGB 11.5* 12.6* 13.5    Basename 09/17/11 0014 09/16/11 0920  WBC 16.8* 22.0*  RBC 4.33 4.72  HCT 33.2* 36.1*  PLT 197 181    Basename 09/17/11 0014 09/16/11 0920 09/15/11 2244  NA 138 -- 139  K 3.7 -- 3.7  CL 107 -- 104  CO2 24 -- 22  BUN 12 -- 13  CREATININE 0.81 0.83 --  GLUCOSE 115* -- 126*  CALCIUM 8.2* -- 9.3   No results found for this basename: LABPT:2,INR:2 in the last 72 hours  brisk capillary refill all fingertips.  wiggles fingers.  mild discomfort with finger ROM.  dressing clean/dry/intact.  compartments soft.  Assessment/Plan:   Procedure(s) (LRB): IRRIGATION AND DEBRIDEMENT EXTREMITY (Left) Afebrile.  WBC trending downward.  Return to OR today for repeat I&D left hand.  Continue ABx.  Cultures pending.  Austin [name] R 09/17/2011, 8:56 AM

## 2011-09-18 ENCOUNTER — Encounter (HOSPITAL_COMMUNITY): Payer: Self-pay | Admitting: Orthopedic Surgery

## 2011-09-18 LAB — CBC
MCH: 26.5 pg (ref 26.0–34.0)
MCHC: 34.5 g/dL (ref 30.0–36.0)
MCV: 76.7 fL — ABNORMAL LOW (ref 78.0–100.0)
Platelets: 211 10*3/uL (ref 150–400)
RBC: 4.42 MIL/uL (ref 4.22–5.81)
RDW: 13.1 % (ref 11.5–15.5)

## 2011-09-18 LAB — WOUND CULTURE

## 2011-09-18 NOTE — Progress Notes (Signed)
Hydrotherapy Note  Past Medical History  Diagnosis Date  . Nonspecific elevation of levels of transaminase and lactic acid dehydrogenase (LDH)   . Seizure disorder   . Cerebral palsy    History reviewed. No pertinent past surgical history.  09/18/11 1200  Subjective Assessment  Subjective pt nonverbal, sitting up in bed stimming.    Patient and Family Stated Goals None.    Date of Onset 09/11/11  Prior Treatments 2 I+D's  Evaluation and Treatment  Evaluation and Treatment Procedures Explained to Patient/Family Yes  Evaluation and Treatment Procedures Patient unable to consent due to mental status  Wound 09/18/11 Other (Comment) Hand Left;Other (Comment) I+D Incision on Thumb  Date First Assessed/Time First Assessed: 09/18/11 1223   Wound Type: Other (Comment)  Location: Hand  Location Orientation: Left;Other (Comment)  Wound Description (Comments): I+D Incision on Thumb  Present on Admission: No  Site / Wound Assessment Bleeding;Black;Brown;Painful;Pink;Red;Other (Comment) (Exposed Tendon and Bone)  % Wound base Red or Granulating 30%  % Wound base Yellow 20%  % Wound base Black 10%  % Wound base Other (Comment) 40% (Tendon and Bone)  Peri-wound Assessment Edema  Wound Length (cm) 9 cm  Wound Width (cm) 3.4 cm  Wound Depth (cm) 2.4 cm  Tunneling (cm) (Difficult to asses secondary to pt's occasionally stimming. )  Margins Unattacted edges (unapproximated)  Closure None  Drainage Amount Copious  Drainage Description Serosanguineous;No odor  Treatment Debridement (Selective);Hydrotherapy (Pulse lavage)  Dressing Type Compression wrap;Gauze (Comment);Moist to dry (Iodoform packing, Xeroform, W-D, 4x4s, Kerlix, Splint, Ace)  Dressing Changed Changed  Dressing Status Clean;Dry;Intact  Wound 09/18/11 Other (Comment) Hand Left;Posterior I+D Incision  Date First Assessed/Time First Assessed: 09/18/11 1226   Wound Type: Other (Comment)  Location: Hand  Location Orientation:  Left;Posterior  Wound Description (Comments): I+D Incision  Present on Admission: No  Site / Wound Assessment Painful;Pink;Red;Yellow  % Wound base Red or Granulating 70%  % Wound base Yellow 30%  Peri-wound Assessment Edema  Wound Length (cm) 3.5 cm  Wound Width (cm) 1.2 cm  Wound Depth (cm) 1.4 cm  Margins Unattacted edges (unapproximated)  Closure None  Drainage Amount Moderate  Drainage Description Serous;No odor  Treatment Debridement (Selective);Hydrotherapy (Pulse lavage)  Dressing Type Compression wrap;Gauze (Comment) (Iodoform packing, Xeroform, W-D, 4x4s, kerlix, splint, ace)  Dressing Changed Changed  Dressing Status Clean;Dry;Intact  Wound 09/18/11 Other (Comment) Hand Left;Other (Comment) I+D Incision on palm  Date First Assessed/Time First Assessed: 09/18/11 1228   Wound Type: Other (Comment)  Location: Hand  Location Orientation: Left;Other (Comment)  Wound Description (Comments): I+D Incision on palm  Present on Admission: No  Site / Wound Assessment Black;Painful;Pink (Difficult to see periwound)  % Wound base Red or Granulating 50%  % Wound base Yellow 25%  % Wound base Black 25%  Peri-wound Assessment Edema  Wound Length (cm) 6.1 cm  Wound Width (cm) 0.4 cm  Wound Depth (cm) 0.9 cm  Tunneling (cm) (Tunnels under 2 stitches.  Difficult to measure.)  Margins Unattacted edges (unapproximated)  Closure Sutures (2 sutures at ulnar side of wound)  Drainage Amount Minimal  Drainage Description Serous;No odor  Treatment Debridement (Selective);Hydrotherapy (Pulse lavage)  Dressing Type Compression wrap;Gauze (Comment) (Iodoform packing, Xeroform, W-D, 4x4s, kerlix, splint, ace)  Dressing Changed Changed  Dressing Status Clean;Dry;Intact  Wound 09/18/11 Other (Comment) Hand Left;Other (Comment) I+D Thenar [place]  Date First Assessed/Time First Assessed: 09/18/11 1229   Wound Type: Other (Comment)  Location: Hand  Location Orientation: Left;Other (Comment)  Wound  Description (Comments): I+D Thenar [place]  Present on Admission: No  Site / Wound Assessment Bleeding;Painful;Pink;Red (difficult to see secondary small size.)  % Wound base Red or Granulating 90%  % Wound base Yellow 10%  Peri-wound Assessment Edema  Wound Length (cm) 1.8 cm  Wound Width (cm) 0.2 cm  Wound Depth (cm) 0.9 cm  Margins Unattacted edges (unapproximated)  Closure None  Drainage Amount Moderate  Drainage Description Sanguineous;No odor  Treatment Debridement (Selective);Hydrotherapy (Pulse lavage)  Dressing Type Compression wrap;Gauze (Comment) (Iodoform packing, Xeroform, W-D, 4x4s, kerlix, splint, ace)  Dressing Changed Changed  Dressing Status Clean;Dry;Intact  Hydrotherapy  Pulsed Lavage with Suction (psi) 4 psi  Pulsed Lavage with Suction - Normal Saline Used 1000 mL  Pulsed Lavage Tip Tip with splash shield  Pulsed lavage therapy - wound location 4 wounds L hand  Selective Debridement  Selective Debridement - Location None done secondary to pt stims with UEs and is difficult to keep still.    Wound Therapy - Assess/Plan/Recommendations  Wound Therapy - Clinical Statement pt with Cellulitis and abscess of L hand now s/p 2 I+Ds.  pt would benefit from [facility] for wound cleansing and healing.  Spoke with RN about trying either increased pain meds or something to help calm pt for subsequent hydro treatments to help keep pt still.    Wound Therapy - Functional Problem List 4 I+D wounds with drainage.    Factors Delaying/Impairing Wound Healing Incontinence;Infection - systemic/local;Immobility (Decreased Cognition)  Hydrotherapy Plan Debridement;Dressing change;Patient/family education;Pulsatile lavage with suction  Wound Therapy - Frequency 3X / week  Wound Therapy - Current Recommendations Case manager/social work  Wound Therapy - Follow Up Recommendations Skilled nursing facility  Wound Therapy Goals - Improve the function of patient's integumentary system by  progressing the wound(s) through the phases of wound healing by:  Decrease Necrotic Tissue to 25%  Decrease Necrotic Tissue - Progress Goal set today  Increase Granulation Tissue to 75%  Increase Granulation Tissue - Progress Goal set today  Decrease Length/Width/Depth by (cm) 0.5  Decrease Length/Width/Depth - Progress Goal set today  Improve Drainage Characteristics Min  Improve Drainage Characteristics - Progress Goal set today  Patient/Family will be able to  Staff/CGs will be able to demo positioning and dressing changes.    Patient/Family Instruction Goal - Progress Goal set today  Goals/treatment plan/discharge plan were made with and agreed upon by patient/family No, Patient unable to participate in goals/treatment/discharge plan and family unavailable  Time For Goal Achievement 7 days  Wound Therapy - Potential for Goals Good    Denise Washburn, PT [phone]

## 2011-09-18 NOTE — Progress Notes (Signed)
Utilization Review Completed.Moesha Sarchet T3/25/2013   

## 2011-09-18 NOTE — Progress Notes (Addendum)
Subjective:   Patient was seen earlier this morning. This is a late entry. Patient was sitting up on his bed and rocking back and forth which is not unusual for him.  Objective  Vital signs in last 24 hours: Filed Vitals:   09/17/11 2010 09/18/11 0309 09/18/11 0532 09/18/11 1445  BP: 140/85 125/86 147/94 137/91  Pulse: 92 99 88 85  Temp: 99.3 F (37.4 C) 97.4 F (36.3 C) 97.9 F (36.6 C) 97.5 F (36.4 C)  TempSrc: Oral Axillary Oral Oral  Resp: 18 20 18 18   Height:      Weight:      SpO2: 98% 99% 100% 100%   Weight change:   Intake/Output Summary (Last 24 hours) at 09/18/11 1916 Last data filed at 09/18/11 1446  Gross per 24 hour  Intake   1450 ml  Output   1875 ml  Net   -425 ml    Physical Exam:  General Exam: Comfortable.  Head: Large area of alopecia on the occipital scalp, said to be secondary to rocking motion and rubbing of his back of his head to the chair. No acute findings. Respiratory System: Clear. No increased work of breathing.  Cardiovascular System: First and second heart sounds heard. Regular rate and rhythm. No JVD/murmurs.  Gastrointestinal System: Abdomen is non distended, soft and normal bowel sounds heard.  Central Nervous System: Alert and nonverbal hence cannot assess orientation and he does not follow commands. No focal neurological deficits. Extremities: Left forearm and hand are dressed and the dressing appears clean and dry. Fingers appear to have normal color and good capillary refill. Right lower extremity contracture.  Labs:  Basic Metabolic Panel:  Lab 09/17/11 1610 09/16/11 0920 09/15/11 2244  NA 138 -- 139  K 3.7 -- 3.7  CL 107 -- 104  CO2 24 -- 22  GLUCOSE 115* -- 126*  BUN 12 -- 13  CREATININE 0.81 0.83 0.91  CALCIUM 8.2* -- 9.3  ALB -- -- --  PHOS -- -- --   Liver Function Tests: No results found for this basename: AST:3,ALT:3,ALKPHOS:3,BILITOT:3,PROT:3,ALBUMIN:3 in the last 168 hours No results found for this basename:  LIPASE:3,AMYLASE:3 in the last 168 hours No results found for this basename: AMMONIA:3 in the last 168 hours CBC:  Lab 09/18/11 0540 09/17/11 1533 09/17/11 0014 09/16/11 0920 09/15/11 2244  WBC 12.9* 14.1* 16.8* -- --  NEUTROABS -- -- -- -- 22.7*  HGB 11.7* 12.1* 11.5* -- --  HCT 33.9* 34.5* 33.2* -- --  MCV 76.7* 76.8* 76.7* 76.5* 77.0*  PLT 211 184 197 -- --   Cardiac Enzymes: No results found for this basename: CKTOTAL:5,CKMB:5,CKMBINDEX:5,TROPONINI:5 in the last 168 hours CBG: No results found for this basename: GLUCAP:5 in the last 168 hours  Iron Studies: No results found for this basename: IRON,TIBC,TRANSFERRIN,FERRITIN in the last 72 hours Studies/Results: No results found. Medications:    . sodium chloride 75 mL/hr at 09/17/11 1510      . carbamazepine  200 mg Oral Daily  . heparin  5,000 Units Subcutaneous Q8H  . piperacillin-tazobactam  3.375 g Intravenous Q8H  . vancomycin  1,000 mg Intravenous BID    I  have reviewed scheduled and prn medications.     Problem/Plan: Active Problems:  CEREBRAL PALSY  Swelling of limb  SEIZURE DISORDER  Alopecia  Furunculosis  1. Left hand abscess and cellulitis: Status post irrigation and debridement on 09/16/2011 and 09/17/2011. Continue intravenous vancomycin and Zosyn. Management per hand surgery. Seems to be improving. 2.  Leukocytosis: Secondary to problem #1. Decreasing. 3. Mild anemia: Stable. 4. Cerebral palsy with associated mental retardation: Mental status probably at baseline. 5. History of seizure disorder: Continue Tegretol. No reported seizures in hospital.  Called and updated patient's sister Ms.Alford,Carol regarding patient's care and answered questions.   Austin [name] 09/18/2011,7:16 PM  LOS: 3 days

## 2011-09-19 LAB — CBC
MCHC: 34.2 g/dL (ref 30.0–36.0)
Platelets: 213 10*3/uL (ref 150–400)
RDW: 13 % (ref 11.5–15.5)
WBC: 8.9 10*3/uL (ref 4.0–10.5)

## 2011-09-19 MED ORDER — AMOXICILLIN-POT CLAVULANATE 875-125 MG PO TABS: 1.0000 | ORAL_TABLET | Freq: Two times a day (BID) | ORAL | Status: AC

## 2011-09-19 MED ORDER — OXYCODONE-ACETAMINOPHEN 5-325 MG PO TABS: 1.0000 | ORAL_TABLET | Freq: Four times a day (QID) | ORAL | Status: AC | PRN

## 2011-09-19 NOTE — Progress Notes (Signed)
ANTIBIOTIC CONSULT NOTE - FOLLOW UP  Pharmacy Consult for Vancomycin/Zosyn Indication: L hand wound  No Known Allergies  Patient Measurements: Height: 5\' 6"  (167.6 cm) Weight: 115 lb (52.164 kg) IBW/kg (Calculated) : 63.8   Vital Signs: Temp: 97.9 F (36.6 C) (03/26 0500) BP: 140/89 mmHg (03/26 0500) Pulse Rate: 87  (03/26 0500) Intake/Output from previous day: 03/25 0701 - 03/26 0700 In: 1200 [P.O.:960; I.V.:240] Out: 3025 [Urine:3025] Intake/Output from this shift:    Labs:  Basename 09/19/11 0530 09/18/11 0540 09/17/11 1533 09/17/11 0014  WBC 8.9 12.9* 14.1* --  HGB 10.8* 11.7* 12.1* --  PLT 213 211 184 --  LABCREA -- -- -- --  CREATININE -- -- -- 0.81   Estimated Creatinine Clearance: 92.2 ml/min (by C-G formula based on Cr of 0.81).  Microbiology:  3/23 wound cx>>mod staph, pan sens 3/22 BC X 2>>ngtd  Vancomycin 1 gm q12 3/23 >> Zosyn 3/23>>  Assessment: 36 yo M admitted with L hand swelling and erythema, s/p OR x 2 for I&D of L hand wound.  Wound cx is staph aureus that is pan sensitive.  Blood cx ngtd.  Renal fxn remains stable with CrCl ~ 90.    Goal of Therapy:  Vancomycin trough level 10-15 mcg/ml  Plan:  Consider narrowing antibiotic coverage for MSSA to Ancef 1gm IV Q8h. If Vancomycin continues, will need to check trough level in next 48 hours. Will follow-up clinical progress and abx plan.  [employer], Pharm.D., BCPS Clinical Pharmacist Pager [phone] 09/19/2011 9:29 AM

## 2011-09-19 NOTE — Discharge Summary (Signed)
Discharge Summary  Austin [name] MR#: [id number]  DOB:1976/03/27  Date of Admission: 09/15/2011 Date of Discharge: 09/19/2011  Patient's PCP: Austin Harder, MD, MD  Attending Physician:Kirstein Baxley  Consults: Hand surgery: Tami Ribas, MD   Discharge Diagnoses: Active Problems:  CEREBRAL PALSY  Swelling of limb  SEIZURE DISORDER  Alopecia  Furunculosis  Abscess and cellulitis of left hand.  Anemia  Brief Admitting History and Physical 36 y.o. male with history of cerebral palsy, nonverbal, brought into the emergency room for left hand cellulitis. He stayed home and care for by his sisters with help from a home health aid as well. His sister was out of town, and when she returned she noted the swelling of his hand. He has a leukocytosis with a white count of 25,000. His blood glucose is 1.6 and his creatinine is normal. He also has history of seizure, and has been on Tegretol.   Discharge Medications Current Discharge Medication List    START taking these medications   Details  amoxicillin-clavulanate (AUGMENTIN) 875-125 MG per tablet Take 1 tablet by mouth 2 (two) times daily. Qty: 20 tablet, Refills: 0    oxyCODONE-acetaminophen (PERCOCET) 5-325 MG per tablet Take 1 tablet by mouth every 6 (six) hours as needed for pain. Qty: 30 tablet, Refills: 0      CONTINUE these medications which have NOT CHANGED   Details  carbamazepine (CARBATROL) 200 MG 12 hr capsule Take 200 mg by mouth daily.      STOP taking these medications     carbamazepine (TEGRETOL) 200 MG tablet Comments:  Reason for Stopping:          Hospital Course: <principal problem not specified> Present on Admission:  .Swelling of limb .SEIZURE DISORDER .Furunculosis .CEREBRAL PALSY .Alopecia   1. Left hand abscess and cellulitis: Hand surgery was consulted. Status post irrigation and debridement on 09/16/2011 and 09/17/2011. Continued intravenous vancomycin and Zosyn. No fevers. White cell count  has normalized. Patient does not appear in significant pain. Cultures grew methicillin sensitive Staphylococcus aureus. Discussed with Dr. Merlyn Lot who recommends discharging home after whirlpool therapy today, on 10 days of oral Augmentin. Patient will followup with him on 3/28 for outpatient hydrotherapy and followup.  2. Leukocytosis: Secondary to problem #1. Resolved.  3. Mild anemia: Fairly stable. Outpatient followup of CBC in 10-14 days.  4. Cerebral palsy with associated mental retardation: Mental status probably at baseline.  5. History of seizure disorder: Continue Tegretol. No reported seizures in hospital   Day of Discharge BP 131/90  Pulse 77  Temp(Src) 98.9 F (37.2 C) (Oral)  Resp 18  Ht 5\' 6"  (1.676 m)  Wt 52.164 kg (115 lb)  BMI 18.56 kg/m2  SpO2 95%  General Exam: Comfortable.  Head: Large area of alopecia on the occipital scalp, said to be secondary to rocking motion and rubbing of his back of his head to the chair. No acute findings.  Respiratory System: Clear. No increased work of breathing.  Cardiovascular System: First and second heart sounds heard. Regular rate and rhythm. No JVD/murmurs.  Gastrointestinal System: Abdomen is non distended, soft and normal bowel sounds heard.  Central Nervous System: Alert and nonverbal hence cannot assess orientation and he does not follow commands. No focal neurological deficits.  Extremities: Left forearm and hand are dressed and the dressing appears clean and dry. Fingers appear to have normal color and good capillary refill. Right lower extremity contracture.    Results for orders placed during the hospital encounter of 09/15/11 (from  the past 48 hour(s))  CBC     Status: Abnormal   Collection Time   09/17/11  3:33 PM      Component Value Range Comment   WBC 14.1 (*) 4.0 - 10.5 (K/uL)    RBC 4.49  4.22 - 5.81 (MIL/uL)    Hemoglobin 12.1 (*) 13.0 - 17.0 (g/dL)    HCT 16.1 (*) 09.6 - 52.0 (%)    MCV 76.8 (*) 78.0 - 100.0  (fL)    MCH 26.9  26.0 - 34.0 (pg)    MCHC 35.1  30.0 - 36.0 (g/dL)    RDW 04.5  40.9 - 81.1 (%)    Platelets 184  150 - 400 (K/uL)   CBC     Status: Abnormal   Collection Time   09/18/11  5:40 AM      Component Value Range Comment   WBC 12.9 (*) 4.0 - 10.5 (K/uL)    RBC 4.42  4.22 - 5.81 (MIL/uL)    Hemoglobin 11.7 (*) 13.0 - 17.0 (g/dL)    HCT 91.4 (*) 78.2 - 52.0 (%)    MCV 76.7 (*) 78.0 - 100.0 (fL)    MCH 26.5  26.0 - 34.0 (pg)    MCHC 34.5  30.0 - 36.0 (g/dL)    RDW 95.6  21.3 - 08.6 (%)    Platelets 211  150 - 400 (K/uL)   CBC     Status: Abnormal   Collection Time   09/19/11  5:30 AM      Component Value Range Comment   WBC 8.9  4.0 - 10.5 (K/uL)    RBC 4.16 (*) 4.22 - 5.81 (MIL/uL)    Hemoglobin 10.8 (*) 13.0 - 17.0 (g/dL)    HCT 57.8 (*) 46.9 - 52.0 (%)    MCV 76.0 (*) 78.0 - 100.0 (fL)    MCH 26.0  26.0 - 34.0 (pg)    MCHC 34.2  30.0 - 36.0 (g/dL)    RDW 62.9  52.8 - 41.3 (%)    Platelets 213  150 - 400 (K/uL)     Lab data: 1. BMP on 3/24: Significant for glucose of 115. Otherwise within normal limits. 2. Left hand wound culture: No anaerobes isolated and culture in progress. 3. Left hand wound culture: Methicillin sensitive Staphylococcus aureus.   Dg Hand Complete Left  09/15/2011  *RADIOLOGY REPORT*  Clinical Data: Severe left hand swelling and infection.  Evaluate for osteomyelitis.  LEFT HAND - COMPLETE 3+ VIEW  Comparison: None.  Findings: Soft tissue swelling is seen involving the palm and proximal index finger.  No evidence of bone destruction, lucency, or periosteal reaction.  No evidence of fracture or dislocation. No evidence of arthropathy.  No evidence of soft tissue gas or radiopaque foreign body.  IMPRESSION: Soft tissue swelling involving the palmar and proximal index finger.  No osseous abnormality.  Original Report Authenticated By: Danae Orleans, M.D.     Disposition: Discharged home in stable condition.  Diet: Heart Healthy Diet.  Activity:  Increase activity gradually/as tolerated   Follow-up Appts: Discharge Orders    Future Orders Please Complete By Expires   Diet - low sodium heart healthy      Increase activity slowly      Discharge wound care:      Comments:   Do not change dressing until followup with hand surgeon.   Call MD for:  temperature >100.4      Call MD for:  severe uncontrolled pain  Call MD for:  redness, tenderness, or signs of infection (pain, swelling, redness, odor or green/yellow discharge around incision site)         TESTS THAT NEED FOLLOW-UP CBC in 10-14 days.  Time spent on discharge, talking to the patient, and coordinating care: 30 mins.   Discussed with patient's sister Ms. Wilson Singer via telephone and updated care and answered questions.  SignedMarcellus Scott, MD 09/19/2011, 3:32 PM

## 2011-09-19 NOTE — Progress Notes (Signed)
Hydrotherapy Note   09/19/11 1600  Subjective Assessment  Subjective Per RN, MD phoned and requested Hydro see pt again today in prep for D/C home later this pm.    Evaluation and Treatment  Evaluation and Treatment Procedures Explained to Patient/Family Yes  Evaluation and Treatment Procedures Patient unable to consent due to mental status  Wound 09/18/11 Other (Comment) Hand Left;Other (Comment) I+D Incision on Thumb  Date First Assessed/Time First Assessed: 09/18/11 1223   Wound Type: Other (Comment)  Location: Hand  Location Orientation: Left;Other (Comment)  Wound Description (Comments): I+D Incision on Thumb  Present on Admission: No  Site / Wound Assessment Bleeding;Black;Brown;Painful;Pink;Red;Other (Comment)  % Wound base Red or Granulating 30%  % Wound base Yellow 20%  % Wound base Black 10%  % Wound base Other (Comment) 40%  Peri-wound Assessment Edema  Wound Length (cm) 9 cm  Wound Width (cm) 3.4 cm  Wound Depth (cm) 2.4 cm  Tunneling (cm) (Difficult to assess)  Margins Unattacted edges (unapproximated)  Closure None  Drainage Amount Copious  Drainage Description Serosanguineous;No odor  Treatment Debridement (Selective);Hydrotherapy (Pulse lavage)  Dressing Type Compression wrap;Gauze (Comment);Moist to dry (Iodoform packing, Xeroform, 4x4s, kerlix, splint, ace)  Dressing Changed Changed  Dressing Status Clean;Dry;Intact  Wound 09/18/11 Other (Comment) Hand Left;Posterior I+D Incision  Date First Assessed/Time First Assessed: 09/18/11 1226   Wound Type: Other (Comment)  Location: Hand  Location Orientation: Left;Posterior  Wound Description (Comments): I+D Incision  Present on Admission: No  Site / Wound Assessment Painful;Pink;Red;Yellow  % Wound base Red or Granulating 70%  % Wound base Yellow 30%  Peri-wound Assessment Edema  Wound Length (cm) 3.5 cm  Wound Width (cm) 1.2 cm  Wound Depth (cm) 1.4 cm  Margins Unattacted edges (unapproximated)  Closure None    Drainage Amount Moderate  Drainage Description Serous;No odor  Treatment Debridement (Selective);Hydrotherapy (Pulse lavage)  Dressing Type Compression wrap;Gauze (Comment) (Iodoform packing, Xeroform, 4x4s, kerlix, splint, ace)  Dressing Changed Changed  Dressing Status Clean;Dry;Intact  Wound 09/18/11 Other (Comment) Hand Left;Other (Comment) I+D Incision on palm  Date First Assessed/Time First Assessed: 09/18/11 1228   Wound Type: Other (Comment)  Location: Hand  Location Orientation: Left;Other (Comment)  Wound Description (Comments): I+D Incision on palm  Present on Admission: No  Site / Wound Assessment Black;Painful;Pink  % Wound base Red or Granulating 50%  % Wound base Yellow 25%  % Wound base Black 25%  Peri-wound Assessment Edema  Wound Length (cm) 6.1 cm  Wound Width (cm) 0.4 cm  Wound Depth (cm) 0.9 cm  Tunneling (cm) (Tunnels under 2 stitches.  Difficult to measure)  Margins Unattacted edges (unapproximated)  Closure Sutures  Drainage Amount Minimal  Drainage Description Serous;No odor  Treatment Debridement (Selective);Hydrotherapy (Pulse lavage)  Dressing Type Compression wrap;Gauze (Comment) (Iodoform packing, Xeroform, 4x4s, kerlix, splint, ace)  Dressing Changed Changed  Dressing Status Clean;Dry;Intact  Wound 09/18/11 Other (Comment) Hand Left;Other (Comment) I+D [place]  Date First Assessed/Time First Assessed: 09/18/11 1229   Wound Type: Other (Comment)  Location: Hand  Location Orientation: Left;Other (Comment)  Wound Description (Comments): I+D [place]  Present on Admission: No  Site / Wound Assessment Bleeding;Painful;Pink;Red  % Wound base Red or Granulating 90%  % Wound base Yellow 10%  Peri-wound Assessment Edema  Wound Length (cm) 1.8 cm  Wound Width (cm) 0.2 cm  Wound Depth (cm) 0.9 cm  Margins Unattacted edges (unapproximated)  Closure None  Drainage Amount Moderate  Drainage Description Sanguineous;No odor  Treatment  Debridement (  Selective);Hydrotherapy (Pulse lavage)  Dressing Type (Iodoform packing, Xeroform, 4x4s, kerlix, splint, ace)  Dressing Changed Changed  Dressing Status Clean;Dry;Intact  Hydrotherapy  Pulsed Lavage with Suction (psi) 4 psi  Pulsed Lavage with Suction - Normal Saline Used 1000 mL  Pulsed Lavage Tip Tip with splash shield  Pulsed lavage therapy - wound location 4 wounds L hand  Selective Debridement  Selective Debridement - Location None done secondary to pt stims with UEs and is difficult to keep still.    Wound Therapy - Assess/Plan/Recommendations  Wound Therapy - Clinical Statement pt with Cellulitis and abscess of L hand now s/p 2 I+Ds.  pt would benefit from [facility] for wound cleansing and healing.  Spoke with RN about trying either increased pain meds or something to help calm pt for subsequent hydro treatments to help keep pt still.    Wound Therapy - Functional Problem List 4 I+D wounds with drainage.    Factors Delaying/Impairing Wound Healing Incontinence;Infection - systemic/local;Immobility  Hydrotherapy Plan Debridement;Dressing change;Patient/family education;Pulsatile lavage with suction  Wound Therapy - Frequency 3X / week  Wound Therapy - Current Recommendations Case manager/social work  Wound Therapy - Follow Up Recommendations Skilled nursing facility  Wound Therapy Goals - Improve the function of patient's integumentary system by progressing the wound(s) through the phases of wound healing by:  Decrease Necrotic Tissue - Progress Progressing toward goal  Increase Granulation Tissue - Progress Progressing toward goal  Decrease Length/Width/Depth - Progress Progressing toward goal  Improve Drainage Characteristics - Progress Progressing toward goal  Patient/Family Instruction Goal - Progress Progressing toward goal  Goals/treatment plan/discharge plan were made with and agreed upon by patient/family No, Patient unable to participate in goals/treatment/discharge  plan and family unavailable  Time For Goal Achievement 7 days  Wound Therapy - Potential for Goals Good    Charbel Los, PT [phone]

## 2011-09-19 NOTE — Progress Notes (Signed)
Nutrition Brief Note:  Pt determined to be at nutrition risk via low braden scale.  Pt currently eating 80-100% of Regular meals. Pt is non-verbal.  Eats with assistance.  RN reports pt is tolerating well and eating well.  No nutrition dx with warranted interventions at this time.  Please consult RD if needed.  RD will continue to monitor for change in status and nutritional adequacy.  Austin [name] Pager: [phone]

## 2011-09-20 LAB — CARBAMAZEPINE, FREE AND TOTAL
Carbamazepine Metabolite/: 0.2 ug/mL
Carbamazepine, Bound: 3.5 ug/mL
Carbamazepine, Free: 1 ug/mL (ref 1.0–3.0)

## 2011-09-21 DIAGNOSIS — L03119 Cellulitis of unspecified part of limb: Secondary | ICD-10-CM | POA: Diagnosis not present

## 2011-09-21 DIAGNOSIS — L02519 Cutaneous abscess of unspecified hand: Secondary | ICD-10-CM | POA: Diagnosis not present

## 2011-09-21 LAB — ANAEROBIC CULTURE

## 2011-09-22 LAB — CULTURE, BLOOD (ROUTINE X 2)
Culture  Setup Time: 201303230132
Culture: NO GROWTH

## 2011-09-25 DIAGNOSIS — L02519 Cutaneous abscess of unspecified hand: Secondary | ICD-10-CM | POA: Diagnosis not present

## 2011-09-25 DIAGNOSIS — L03119 Cellulitis of unspecified part of limb: Secondary | ICD-10-CM | POA: Diagnosis not present

## 2011-09-26 DIAGNOSIS — L03119 Cellulitis of unspecified part of limb: Secondary | ICD-10-CM | POA: Diagnosis not present

## 2011-09-26 DIAGNOSIS — L02519 Cutaneous abscess of unspecified hand: Secondary | ICD-10-CM | POA: Diagnosis not present

## 2011-10-04 ENCOUNTER — Encounter: Payer: Self-pay | Admitting: Internal Medicine

## 2011-10-04 ENCOUNTER — Ambulatory Visit (INDEPENDENT_AMBULATORY_CARE_PROVIDER_SITE_OTHER): Payer: Medicare Other | Admitting: Internal Medicine

## 2011-10-04 VITALS — BP 139/89 | HR 97

## 2011-10-04 DIAGNOSIS — R569 Unspecified convulsions: Secondary | ICD-10-CM

## 2011-10-04 DIAGNOSIS — R21 Rash and other nonspecific skin eruption: Secondary | ICD-10-CM

## 2011-10-04 DIAGNOSIS — E46 Unspecified protein-calorie malnutrition: Secondary | ICD-10-CM | POA: Diagnosis not present

## 2011-10-04 DIAGNOSIS — D649 Anemia, unspecified: Secondary | ICD-10-CM | POA: Insufficient documentation

## 2011-10-04 DIAGNOSIS — E569 Vitamin deficiency, unspecified: Secondary | ICD-10-CM | POA: Diagnosis not present

## 2011-10-04 MED ORDER — NYSTATIN 100000 UNIT/GM EX POWD: CUTANEOUS | Status: AC

## 2011-10-04 MED ORDER — CARBAMAZEPINE ER 200 MG PO CP12: 200.0000 mg | ORAL_CAPSULE | Freq: Every day | ORAL | Status: DC

## 2011-10-04 NOTE — Assessment & Plan Note (Signed)
Well-controlled, continue carbamazepine

## 2011-10-04 NOTE — Assessment & Plan Note (Signed)
The patient's aide notes a 1-day history of intertrigonal rash, similar to prior rashes occurring around patient's diaper. -nystatin powder to affected area TID x2 weeks, or until lesions resolves -if lesion worsening, or patient develops fever, follow-up in clinic

## 2011-10-04 NOTE — Assessment & Plan Note (Signed)
During the patient's last admission, his Hb dropped from his baseline around 13, to 10.8.  This may represent minimal blood loss from I&D and the body's normal stress response inhibiting erythrogenesis.  However, long-standing low MCV raises concern for iron deficiency vs ?thalasemia -check anemia panel today -if iron deficient, will prescribe PO iron supplementation -sent patient home with FOBT cards, to check for blood loss in stool.  No FH of colon cancer, very young, low suspicion.

## 2011-10-04 NOTE — Patient Instructions (Signed)
For your diaper rash, apply Nystatin powder to the area three times per day for 2 weeks, or until cleared.  For your low blood counts, we are checking a blood level today, as well as an iron level.  We will call you if the results are abnormal, and may need to start iron pills. -also, low iron may be a sign of blood loss.  We are sending you home with a kit to check for blood in the stool.  Please return for a follow-up visit in 6 months, or sooner if needed.

## 2011-10-04 NOTE — Progress Notes (Signed)
HPI The patient is a 36 yo man, history of cerebral palsy and seizures, presenting for a routine follow-up visit.  The patient was recently hospitalized 09/15/11-09/19/11 for a left hand cellulitis and abscess, s/p I&D and vanc/zosyn, which has been healing well.  The patient had an outpatient follow-up with the hand surgeon 2 days ago, who noted that the wound is healing well.  The patient has a history of seizures, but notes no recent seizure activity, well-controlled with carbamazepine.  The patient's aide notes that he has had a "diaper rash" in his intertrigonal groin area, which is red, and similar to prior rashes he has had in the past, which she first noticed yesterday.  The patient has had no fevers, chills, or groin pain.  During the patient's last hospitalization, his Hb dropped from his baseline around 13 to 10.8.  The patient has a history of low MCV.  ROS: General: no fevers, chills, changes in weight, changes in appetite Skin: no rash HEENT: no blurry vision, hearing changes, sore throat Pulm: no dyspnea, coughing, wheezing CV: no chest pain, palpitations, shortness of breath Abd: no abdominal pain, nausea/vomiting, diarrhea/constipation GU: no dysuria, hematuria, polyuria Ext: no arthralgias, myalgias Neuro: no weakness, numbness, or tingling  Filed Vitals:   10/04/11 1429  BP: 139/89  Pulse: 97    PEX General: alert, sitting in chair, non-verbal at baseline HEENT: pupils equal round and reactive to light, oropharynx clear and non-erythematous, scalp with chronic alopecia Neck: supple, no lymphadenopathy Lungs: clear to ascultation bilaterally, normal work of respiration, no wheezes, rales, ronchi Heart: regular rate and rhythm, no murmurs, gallops, or rubs Abdomen: soft, non-tender, non-distended, normal bowel sounds Extremities: no cyanosis, clubbing, or edema Neurologic: sitting in chair, rocking back and forth, moves all extremities  spontaneously  Assessment/Plan

## 2011-10-05 LAB — CBC
HCT: 38 % — ABNORMAL LOW (ref 39.0–52.0)
Hemoglobin: 12.7 g/dL — ABNORMAL LOW (ref 13.0–17.0)
Platelets: 319 10*3/uL (ref 150–400)
RBC: 4.82 MIL/uL (ref 4.22–5.81)
WBC: 4.5 10*3/uL (ref 4.0–10.5)

## 2011-10-05 LAB — ANEMIA PANEL
ABS Retic: 38.6 10*3/uL (ref 19.0–186.0)
Ferritin: 46 ng/mL (ref 22–322)
Iron: 70 ug/dL (ref 42–165)
RBC.: 4.82 MIL/uL (ref 4.22–5.81)
Retic Ct Pct: 0.8 % (ref 0.4–2.3)
TIBC: 253 ug/dL (ref 215–435)
UIBC: 183 ug/dL (ref 125–400)
Vitamin B-12: 550 pg/mL (ref 211–911)

## 2011-10-10 DIAGNOSIS — L02519 Cutaneous abscess of unspecified hand: Secondary | ICD-10-CM | POA: Diagnosis not present

## 2011-10-10 DIAGNOSIS — L03119 Cellulitis of unspecified part of limb: Secondary | ICD-10-CM | POA: Diagnosis not present

## 2011-10-23 DIAGNOSIS — L03119 Cellulitis of unspecified part of limb: Secondary | ICD-10-CM | POA: Diagnosis not present

## 2011-10-23 DIAGNOSIS — L02519 Cutaneous abscess of unspecified hand: Secondary | ICD-10-CM | POA: Diagnosis not present

## 2011-11-01 DIAGNOSIS — L02519 Cutaneous abscess of unspecified hand: Secondary | ICD-10-CM | POA: Diagnosis not present

## 2012-10-28 ENCOUNTER — Other Ambulatory Visit: Payer: Self-pay | Admitting: Internal Medicine

## 2012-11-20 ENCOUNTER — Encounter: Payer: Self-pay | Admitting: Internal Medicine

## 2012-11-20 ENCOUNTER — Ambulatory Visit (INDEPENDENT_AMBULATORY_CARE_PROVIDER_SITE_OTHER): Payer: Medicare Other | Admitting: Internal Medicine

## 2012-11-20 VITALS — BP 117/80 | HR 71 | Temp 98.5°F

## 2012-11-20 DIAGNOSIS — R569 Unspecified convulsions: Secondary | ICD-10-CM | POA: Diagnosis not present

## 2012-11-20 DIAGNOSIS — G809 Cerebral palsy, unspecified: Secondary | ICD-10-CM

## 2012-11-20 LAB — CBC
HCT: 40.2 % (ref 39.0–52.0)
MCHC: 34.1 g/dL (ref 30.0–36.0)
MCV: 77.8 fL — ABNORMAL LOW (ref 78.0–100.0)
Platelets: 210 10*3/uL (ref 150–400)
RDW: 14.2 % (ref 11.5–15.5)
WBC: 5.9 10*3/uL (ref 4.0–10.5)

## 2012-11-20 LAB — COMPLETE METABOLIC PANEL WITH GFR
AST: 16 U/L (ref 0–37)
Albumin: 4.2 g/dL (ref 3.5–5.2)
Alkaline Phosphatase: 81 U/L (ref 39–117)
BUN: 11 mg/dL (ref 6–23)
Calcium: 9 mg/dL (ref 8.4–10.5)
Chloride: 104 mEq/L (ref 96–112)
GFR, Est Non African American: >89 mL/min
Glucose, Bld: 93 mg/dL (ref 70–99)
Potassium: 3.8 mEq/L (ref 3.5–5.3)
Sodium: 141 mEq/L (ref 135–145)
Total Protein: 7.4 g/dL (ref 6.0–8.3)

## 2012-11-20 NOTE — Progress Notes (Signed)
HPI The patient is a 37 y.o. male with a history of cerebral palsy, seizure disorder, presenting for a routine follow-up visit.  The patient had an unknown injury to his left 4th finger, with bruising of the L 4th fingernail.  The patient's aunt would like to have the area checked out.  The patient has a history of seizure disorder, managed on carbamazepine.  He has not had any recent seizure activity.  The patient's aunt notes difficulty getting transportation to their house, and asks if there are any other resources they can access for transportation assistance.  The patient's aunt notes that he seems to grind his teeth.  She asks if he would benefit from a mouth guard.  ROS: General: no fevers, chills, changes in weight, changes in appetite Skin: no rash HEENT: no blurry vision, hearing changes, sore throat Pulm: no dyspnea, coughing, wheezing CV: no chest pain, palpitations, shortness of breath Abd: no abdominal pain, nausea/vomiting, diarrhea/constipation GU: no dysuria, hematuria, polyuria Ext: no arthralgias, myalgias Neuro: no weakness, numbness, or tingling  Filed Vitals:   11/20/12 1500  BP: 117/80  Pulse: 71  Temp: 98.5 F (36.9 C)    PEX General: alert, cooperative, and in no apparent distress HEENT: pupils equal round and reactive to light, vision grossly intact, oropharynx clear and non-erythematous, teeth with visible plaque but no tooth fractures. Alopecia noted on scalp. Neck: supple, no lymphadenopathy Lungs: clear to ascultation bilaterally, normal work of respiration, no wheezes, rales, ronchi Heart: regular rate and rhythm, no murmurs, gallops, or rubs Abdomen: soft, non-tender, non-distended, normal bowel sounds Extremities: no cyanosis, clubbing, or edema.  L 4th fingernail with dark purple discoloration, though with a rim of proximal pink nail tissue Neurologic: alert & oriented X3, cranial nerves II-XII intact, strength grossly intact, sensation intact  to light touch  Current Outpatient Prescriptions on File Prior to Visit  Medication Sig Dispense Refill  . carbamazepine (TEGRETOL) 200 MG tablet TAKE 1 TABLET BY MOUTH DAILY.  30 tablet  11   No current facility-administered medications on file prior to visit.    Assessment/Plan  Left 4th Finger Injury The patient has a large contusion on his left 4th finger, with a rim of proximal healing pink nail tissue, representing a resolving injury. -the family was reassured, the area is expected to heal on its own  Teeth Grinding The patient's family feels that he grinds his teeth.  On visual inspection, I saw no chipped teeth, but did notice significant plaque buildup.  We discussed referral to dentistry, but patient's aunt prefers to research this on her own. -can offer dental referral in the future if needed.

## 2012-11-20 NOTE — Patient Instructions (Signed)
General Instructions: We are checking routine blood work today, and will call you if any of the results are abnormal.  Please return for a follow-up visit in 1 year.   Treatment Goals:  Goals (1 Years of Data) as of 11/20/12   None      Progress Toward Treatment Goals:    Self Care Goals & Plans:       Care Management & Community Referrals:  Referral 11/20/2012  Referrals made to community resources none

## 2012-11-20 NOTE — Assessment & Plan Note (Signed)
Chronic, stable.  The patient notes no recent seizure activity. -we will check routine labs today

## 2012-11-21 ENCOUNTER — Telehealth: Payer: Self-pay | Admitting: Licensed Clinical Social Worker

## 2012-11-21 NOTE — Telephone Encounter (Signed)
Mr. Nied was referred to CSW for transportation concerns.  Pt's primary caregiver, Ms. Oneta Rack, is having increased difficulty transporting Mr. Dovidio.  Pt lives outside of [facility] limits therefore does not qualify for [place]. Pt has partial medicaid benefits. CSW discussed [employer].  CSW will mail application and brochure for [employer] to patient.  Ms. Alford aware CSW is available to assist as needed.  CSW will sign off at this time.

## 2012-11-25 NOTE — Progress Notes (Signed)
Case discussed with Dr. Brown (at time of visit, soon after the resident saw the patient).  We reviewed the resident's history and exam and pertinent patient test results.  I agree with the assessment, diagnosis, and plan of care documented in the resident's note. 

## 2013-02-25 ENCOUNTER — Telehealth: Payer: Self-pay | Admitting: *Deleted

## 2013-02-25 NOTE — Telephone Encounter (Signed)
Pts

## 2013-02-25 NOTE — Telephone Encounter (Signed)
Caregiver states that pt had a seizure Sunday and bit his finger, it continues to bother him so she would like for pt to be seen, refuses today appt or ED, request tomorrow appt(wed) and it is given for 0900 dr Yetta Barre also a regular pcp check up appt is set later in the month w/ dr brown

## 2013-02-26 ENCOUNTER — Ambulatory Visit (INDEPENDENT_AMBULATORY_CARE_PROVIDER_SITE_OTHER): Payer: Medicare Other | Admitting: Internal Medicine

## 2013-02-26 ENCOUNTER — Encounter: Payer: Self-pay | Admitting: Internal Medicine

## 2013-02-26 VITALS — BP 125/85 | HR 65 | Temp 97.7°F

## 2013-02-26 DIAGNOSIS — G809 Cerebral palsy, unspecified: Secondary | ICD-10-CM

## 2013-02-26 DIAGNOSIS — S61409A Unspecified open wound of unspecified hand, initial encounter: Secondary | ICD-10-CM | POA: Diagnosis not present

## 2013-02-26 DIAGNOSIS — W503XXA Accidental bite by another person, initial encounter: Secondary | ICD-10-CM

## 2013-02-26 DIAGNOSIS — R569 Unspecified convulsions: Secondary | ICD-10-CM

## 2013-02-26 DIAGNOSIS — S61452A Open bite of left hand, initial encounter: Secondary | ICD-10-CM | POA: Insufficient documentation

## 2013-02-26 MED ORDER — AMOXICILLIN-POT CLAVULANATE 875-125 MG PO TABS: 1.0000 | ORAL_TABLET | Freq: Two times a day (BID) | ORAL | Status: AC

## 2013-02-26 NOTE — Assessment & Plan Note (Signed)
Patient takes Tegretol 200 mg qd for seizures. Well controlled on this medication. Patient has 6-7 seizures/year, recently had one on 02/22/13. Sister claims they are typically tonic-clonic in nature, this recent episode was not witnessed however. Patient sustained a bite wound to the left ring finger as he typically puts his hand in his mouth during a seizure episode. Sister also notes that the patient has a post-ictal phase that can last 1-2 days. -No intervention at this time. Continue w/ Tegretol.

## 2013-02-26 NOTE — Patient Instructions (Addendum)
1. You have hospital follow up appointments as follows:  03/12/13 @ 3:15 PM w/ Dr. Manson Passey  2. Please take all medications as prescribed. Continue taking tegretol 200 mg qd. Take Augementin 875/125 twice a day for 7 days to prevent further infection. 3. Keep wound clean and dry. Bandage at night to prevent from further exposure to pathogens. 4. If you have worsening of your symptoms or new symptoms arise, please call the clinic ([phone]), or go to the ER immediately if symptoms are severe.

## 2013-02-26 NOTE — Progress Notes (Signed)
Subjective:   Patient ID: Austin [name] male   DOB: May 14, 1976 37 y.o.   MRN: [id number]  HPI: [name] is a 37 y.o. M w/ PMHx of cerebral palsy and seizure disorder, presents today as patient bit his finger on Saturday night during a seizure episode and the finger continues to be bothersome. The patient typically puts his hand in his mouth when he has a seizure and has sustained a bite injury in the past because of this. Previous wound culture of the thumb in 08/2011 showed MSSA.  On examination of the bite wound, looks clean and dry, but with some exposed subcutaneous tissue. No signs of infection, no tendon visualized. Otherwise, no issues. Caretaker (sister) reports no further problems. Patient has a loose lower tooth that has been present for several weeks now. Working w/ SW to find a Education officer, community for this issue and consider the idea of a mouth guard for oral protection and further bite injuries.  Seizures are generally well controlled w/ Tegretol 200 mg qd. Sister reports that the patient has 6-7 seizures per year.  Past Medical History  Diagnosis Date  . Nonspecific elevation of levels of transaminase and lactic acid dehydrogenase (LDH)   . Seizure disorder   . Cerebral palsy    Current Outpatient Prescriptions  Medication Sig Dispense Refill  . carbamazepine (TEGRETOL) 200 MG tablet TAKE 1 TABLET BY MOUTH DAILY.  30 tablet  11   No current facility-administered medications for this visit.   No family history on file. History   Social History  . Marital Status: Single    Spouse Name: N/A    Number of Children: N/A  . Years of Education: N/A   Social History Main Topics  . Smoking status: Never Smoker   . Smokeless tobacco: Not on file  . Alcohol Use: No  . Drug Use: No  . Sexual Activity: Not on file   Other Topics Concern  . Not on file   Social History Narrative   Sister is primary caregiver.    Review of Systems: (history obtained from sister) General:  Denies fever, chills, diaphoresis, appetite change and fatigue.  HEENT: Denies eye redness, congestion, rhinorrhea, sneezing, mouth sores, trouble swallowing, neck pain, neck stiffness.  Respiratory: Denies SOB, DOE, cough, and wheezing.   Cardiovascular: Denies chest pain, palpitations and leg swelling.  Gastrointestinal: Denies nausea, vomiting, abdominal pain, diarrhea, constipation, blood in stool and abdominal distention.  Genitourinary: Denies increased urinary frequency, hematuria.  Musculoskeletal: Denies joint swelling, arthralgias. Patient crawls at home. Skin: Denies pallor, rash and wounds.  Neurological: Positive for seizures. Has about 6-7 per year. Has post-ictal lethargy for 1-2 days after a seizure. Last seizure on 02/22/13. Denies weakness.  Hematological: Denies adenopathy,easy bruising, personal or family bleeding history.  Psychiatric/Behavioral: Denies mood changes, sleep disturbance and agitation.  Objective:  Physical Exam: There were no vitals filed for this visit. General: Vital signs reviewed.  Patient is a well-developed and well-nourished, in no acute distress and cooperative with exam. Patient w/ Cerebral Palsy. Generally active, moving in chair, responsive to verbal stimuli. Head: Normocephalic and atraumatic. Small scabbing of parts of scalp in the occipital region. Nose: No erythema or drainage noted.  Turbinates normal. Mouth: No erythema, exudates, sores, or ulcerations. Moist mucus membranes.  Eyes: PERRL, EOMI, conjunctivae normal, No scleral icterus.  Neck: Supple, trachea midline, normal ROM, No JVD, masses, thyromegaly, or carotid bruit present.  Cardiovascular: RRR, S1 normal, S2 normal, no murmurs, gallops, or rubs. Pulmonary/Chest: Normal  respiratory effort, CTAB, no wheezes, rales, or rhonchi. Abdominal: Soft. Non-tender, non-distended, bowel sounds are normal, no masses, organomegaly, or guarding present.  Musculoskeletal: Patient unable to walk  normally, 2/2 cerebral palsy. Atrophy noted in both upper and lower extremities. Unable to assess ROM. Extremities: No swelling or edema,  pulses symmetric and intact bilaterally. No cyanosis or clubbing. Open wound noted on L. Ring finger, w/ some exposed subcutaneous tissue. No evidence of infection. No swelling, erythema, or purulence. Neurological: Patient w/ Cerebral Palsy, neuro exam unable to be performed at this time. Responds to verbal stimuli, very active in wheelchair.  Skin: Warm, dry and intact. No rashes or erythema. Open wound on left ring finger (see above). Psychiatric: Patient w/ Cerebral Palsy. Obvious deficiencies in cognition. Patient non-verbal.  Assessment & Plan:   Please see problem based assessment and plan.

## 2013-02-26 NOTE — Assessment & Plan Note (Signed)
Patient sustained a self-inflicted bite wound to the left ring finger during a seizure episode on 02/22/13 as he typically puts his hand in his mouth during a seizure. Bite wound is 0.5x1.0 cm in the middle region of the left 4th finger. Exposed subcutaneous tissue present, no evidence of underlying tendon or bone. No erythema, swelling or purulence.  Patient has had self-inflicted bite wounds in the past, the most recent to the left thumb in 08/2011, resulting in a cellulitis. Wound culture at that time revealed MSSA.  Given the patient's known history of putting his hands in his mouth and frequent hand wounds secondary to self-inflicted bites and scratches, will start Augmentin 875/125 bid for 7 days. Instructed care-taker to keep wound covered when possible to avoid infection. Patient has appointment scheduled for follow-up w/ PCP, Dr. Janalyn Harder on 03/12/13.

## 2013-02-26 NOTE — Assessment & Plan Note (Signed)
Stable at this time. Patient is very active, moving upper and lower extremities while in wheelchair. Patient is non-verbal.

## 2013-03-05 NOTE — Progress Notes (Signed)
I saw and evaluated the patient.  I personally confirmed the key portions of the history and exam documented by Dr. Jones and I reviewed pertinent patient test results.  The assessment, diagnosis, and plan were formulated together and I agree with the documentation in the resident's note.   

## 2013-03-12 ENCOUNTER — Ambulatory Visit (INDEPENDENT_AMBULATORY_CARE_PROVIDER_SITE_OTHER): Payer: Medicare Other | Admitting: Internal Medicine

## 2013-03-12 ENCOUNTER — Encounter: Payer: Self-pay | Admitting: Internal Medicine

## 2013-03-12 VITALS — HR 76

## 2013-03-12 DIAGNOSIS — L219 Seborrheic dermatitis, unspecified: Secondary | ICD-10-CM

## 2013-03-12 DIAGNOSIS — Z0000 Encounter for general adult medical examination without abnormal findings: Secondary | ICD-10-CM

## 2013-03-12 DIAGNOSIS — W503XXA Accidental bite by another person, initial encounter: Secondary | ICD-10-CM

## 2013-03-12 DIAGNOSIS — Z23 Encounter for immunization: Secondary | ICD-10-CM

## 2013-03-12 DIAGNOSIS — S61409A Unspecified open wound of unspecified hand, initial encounter: Secondary | ICD-10-CM

## 2013-03-12 DIAGNOSIS — R569 Unspecified convulsions: Secondary | ICD-10-CM | POA: Diagnosis not present

## 2013-03-12 DIAGNOSIS — S61452A Open bite of left hand, initial encounter: Secondary | ICD-10-CM

## 2013-03-12 DIAGNOSIS — G809 Cerebral palsy, unspecified: Secondary | ICD-10-CM | POA: Diagnosis not present

## 2013-03-12 MED ORDER — CARBAMAZEPINE 200 MG PO TABS: 200.0000 mg | ORAL_TABLET | Freq: Every day | ORAL | Status: DC

## 2013-03-12 MED ORDER — KETOCONAZOLE 2 % EX CREA: TOPICAL_CREAM | Freq: Two times a day (BID) | CUTANEOUS | Status: DC

## 2013-03-12 MED ORDER — KETOCONAZOLE 2 % EX CREA: TOPICAL_CREAM | Freq: Every day | CUTANEOUS | Status: DC

## 2013-03-12 NOTE — Patient Instructions (Signed)
General Instructions: Your hand wound is healing well.  Continue to keep the area covered as much as possible to prevent infection.  The areas of flaky skin on your scalp, eyebrows, and nose, are called Seborrheic Dermatitis (see info below). -use Ketoconazole cream on the affected areas twice per day for about 4 weeks  Please return for a follow-up visit in about 6 months.   Treatment Goals:  Goals (1 Years of Data) as of 03/12/13   None      Progress Toward Treatment Goals:    Self Care Goals & Plans:       Care Management & Community Referrals:  Referral 03/12/2013  Referrals made to community resources none      Seborrheic Dermatitis Seborrheic dermatitis involves pink or red skin with greasy, flaky scales. This is often found on the scalp, eyebrows, nose, bearded area, and on or behind the ears. It can also occur on the central chest. It often occurs where there are more oil (sebaceous) glands. This condition is also known as dandruff. When this condition affects a baby's scalp, it is called cradle cap. It may come and go for no known reason. It can occur at any time of life from infancy to old age. CAUSES  The cause is unknown. It is not the result of too little moisture or too much oil. In some people, seborrheic dermatitis flare-ups seem to be triggered by stress. It also commonly occurs in people with certain diseases such as Parkinson's disease or HIV/AIDS. SYMPTOMS   Thick scales on the scalp.  Redness on the face or in the armpits.  The skin may seem oily or dry, but moisturizers do not help.  In infants, seborrheic dermatitis appears as scaly redness that does not seem to bother the baby. In some babies, it affects only the scalp. In others, it also affects the neck creases, armpits, groin, or behind the ears.  In adults and adolescents, seborrheic dermatitis may affect only the scalp. It may look patchy or spread out, with areas of redness and flaking.  Other areas commonly affected include:  Eyebrows.  Eyelids.  Forehead.  Skin behind the ears.  Outer ears.  Chest.  Armpits.  Nose creases.  Skin creases under the breasts.  Skin between the buttocks.  Groin.  Some adults and adolescents feel itching or burning in the affected areas. DIAGNOSIS  Your caregiver can usually tell what the problem is by doing a physical exam. TREATMENT   Cortisone (steroid) ointments, creams, and lotions can help decrease inflammation.  Babies can be treated with baby oil to soften the scales, then they may be washed with baby shampoo. If this does not help, a prescription topical steroid medicine may work.  Adults can use medicated shampoos.  Your caregiver may prescribe corticosteroid cream and shampoo containing an antifungal or yeast medicine (ketoconazole). Hydrocortisone or anti-yeast cream can be rubbed directly onto seborrheic dermatitis patches. Yeast does not cause seborrheic dermatitis, but it seems to add to the problem. In infants, seborrheic dermatitis is often worst during the first year of life. It tends to disappear on its own as the child grows. However, it may return during the teenage years. In adults and adolescents, seborrheic dermatitis tends to be a long-lasting condition that comes and goes over many years. HOME CARE INSTRUCTIONS   Use prescribed medicines as directed.  In infants, do not aggressively remove the scales or flakes on the scalp with a comb or by other means. This may  lead to hair loss. SEEK MEDICAL CARE IF:   The problem does not improve from the medicated shampoos, lotions, or other medicines given by your caregiver.  You have any other questions or concerns. Document Released: 06/12/2005 Document Revised: 12/12/2011 Document Reviewed: 11/01/2009 [facility] Patient Information 2014 [place], [place].

## 2013-03-13 DIAGNOSIS — L219 Seborrheic dermatitis, unspecified: Secondary | ICD-10-CM | POA: Insufficient documentation

## 2013-03-13 DIAGNOSIS — Z0000 Encounter for general adult medical examination without abnormal findings: Secondary | ICD-10-CM | POA: Insufficient documentation

## 2013-03-13 NOTE — Assessment & Plan Note (Signed)
Flakey skin noted around nose, eyebrows, and scalp likely represents seborrheic dermatitis -treat with ketoconazole cream BID x4 weeks.

## 2013-03-13 NOTE — Progress Notes (Signed)
HPI The patient is a 37 y.o. male with a history of CP, seizure disorder, presenting for a follow-up visit.  The patient was seen 2 weeks ago for a self-inflicted bite wound to his left 4th finger during a seizure.  He has completed a 7-day course of Augmentin, and the patient's caregiver notes that the area has healed significantly, with no erythema, drainage, warmth, or tenderness.  He has had no further seizure activity since that time.  ROS: Patient is non-verbal at baseline, so full ROS could not be performed, but patient's sister notes no other complaints.  Filed Vitals:   03/12/13 1545  Pulse: 76  BP unable to be obtained due to patient movement  PEX General: sitting in chair, rocking back and forth HEENT: pupils equal round and reactive to light, oropharynx clear and non-erythematous. Dry, flaky skin noted around nose, eyebrows, and scalp. Neck: supple Lungs: exam limited by non-compliance, clear to ascultation bilaterally, normal work of respiration Heart: exam limited by movement, regular rate and rhythm, no murmurs, gallops, or rubs Abdomen: soft, non-tender, non-distended, normal bowel sounds Extremities: left 4th finger with small linear superficial fissure at the flexor crease of the PIP, with surrounding callous formation, without edema, erythema, or drainage Neurologic: Sitting in chair, rocking back and forth, occasional non-purposeful grunting, appeared to move all 4 extremities spontaneously    Medication List              amoxicillin-clavulanate 875-125 MG per tablet  Commonly known as:  AUGMENTIN  Take 1 tablet by mouth 2 (two) times daily.     carbamazepine 200 MG tablet  Commonly known as:  TEGRETOL  Take 1 tablet (200 mg total) by mouth daily.     ketoconazole 2 % cream  Commonly known as:  NIZORAL  Apply topically 2 (two) times daily. Apply to flaky skin on face and scalp for about 4 weeks        Assessment/Plan

## 2013-03-13 NOTE — Assessment & Plan Note (Signed)
At baseline, the patient has 6-7 seizures per year.  He is currently maintained with Tegretol.  The patient's caregiver notes that he used to follow with a neurologist in [place], but since moving to [place], has not established with a Neurologist.  At this time, they feel that his seizure disorder is controlled, and would like to continue to follow with only our clinic to manage this issue.  No further seizure activity since last visit. -refilled Tegretol

## 2013-03-13 NOTE — Progress Notes (Signed)
INTERNAL MEDICINE TEACHING ATTENDING ADDENDUM - Austin Blue, DO,FACP: I reviewed with the resident Dr. Manson Passey, Berenda Morale  medical history, physical examination, diagnosis and results of tests and treatment and I agree with the patient's care as documented.

## 2013-03-13 NOTE — Assessment & Plan Note (Signed)
The patient's bite wound on his L 4th finger appears to be improving, and shows no signs of infection, though a small superficial fissure is still present at the flexor crease of the PIP. -keep area bandaged to prevent infection -no evidence of active infection, no current need for antibiotics

## 2013-03-13 NOTE — Assessment & Plan Note (Signed)
Flu shot given today

## 2013-05-07 ENCOUNTER — Telehealth: Payer: Self-pay | Admitting: Licensed Clinical Social Worker

## 2013-05-07 NOTE — Telephone Encounter (Signed)
CSW received call from Mr. Perrier primary caregiver/sister.  Pt lives with sister and sister states pt is in need of 24 hr supervision which is precluding her from working.  Pt currently has been cut to 6 PCS hours state paid and private pay for additional.  Services are provided by [facility].  CSW placed call to representative from agency that states pt's PCS are not paid through Medicaid/[employer] as pt does not have this benefit.  CSW placed call to sister to notify, pt does not have benefit under Medicaid.

## 2013-10-27 ENCOUNTER — Encounter: Payer: Self-pay | Admitting: Internal Medicine

## 2013-10-27 ENCOUNTER — Ambulatory Visit (INDEPENDENT_AMBULATORY_CARE_PROVIDER_SITE_OTHER): Payer: Medicare Other | Admitting: Internal Medicine

## 2013-10-27 VITALS — Temp 97.8°F

## 2013-10-27 DIAGNOSIS — G809 Cerebral palsy, unspecified: Secondary | ICD-10-CM | POA: Diagnosis not present

## 2013-10-27 DIAGNOSIS — R569 Unspecified convulsions: Secondary | ICD-10-CM

## 2013-10-27 DIAGNOSIS — G40909 Epilepsy, unspecified, not intractable, without status epilepticus: Secondary | ICD-10-CM

## 2013-10-27 DIAGNOSIS — L219 Seborrheic dermatitis, unspecified: Secondary | ICD-10-CM

## 2013-10-27 LAB — COMPLETE METABOLIC PANEL WITH GFR
ALBUMIN: 4.4 g/dL (ref 3.5–5.2)
ALT: 13 U/L (ref 0–53)
AST: 16 U/L (ref 0–37)
Alkaline Phosphatase: 89 U/L (ref 39–117)
BILIRUBIN TOTAL: 0.3 mg/dL (ref 0.2–1.2)
BUN: 14 mg/dL (ref 6–23)
CALCIUM: 9.9 mg/dL (ref 8.4–10.5)
CHLORIDE: 105 meq/L (ref 96–112)
CO2: 27 meq/L (ref 19–32)
Creat: 0.98 mg/dL (ref 0.50–1.35)
GLUCOSE: 69 mg/dL — AB (ref 70–99)
POTASSIUM: 3.9 meq/L (ref 3.5–5.3)
SODIUM: 143 meq/L (ref 135–145)
TOTAL PROTEIN: 7.8 g/dL (ref 6.0–8.3)

## 2013-10-27 LAB — CBC WITH DIFFERENTIAL/PLATELET
BASOS ABS: 0 10*3/uL (ref 0.0–0.1)
BASOS PCT: 0 % (ref 0–1)
EOS ABS: 0.1 10*3/uL (ref 0.0–0.7)
Eosinophils Relative: 2 % (ref 0–5)
HCT: 41.4 % (ref 39.0–52.0)
Hemoglobin: 14.5 g/dL (ref 13.0–17.0)
Lymphocytes Relative: 23 % (ref 12–46)
Lymphs Abs: 1.4 10*3/uL (ref 0.7–4.0)
MCH: 26.6 pg (ref 26.0–34.0)
MCHC: 35 g/dL (ref 30.0–36.0)
MCV: 75.8 fL — ABNORMAL LOW (ref 78.0–100.0)
Monocytes Absolute: 0.6 10*3/uL (ref 0.1–1.0)
Monocytes Relative: 10 % (ref 3–12)
NEUTROS ABS: 3.8 10*3/uL (ref 1.7–7.7)
NEUTROS PCT: 65 % (ref 43–77)
Platelets: 247 10*3/uL (ref 150–400)
RBC: 5.46 MIL/uL (ref 4.22–5.81)
RDW: 13.2 % (ref 11.5–15.5)
WBC: 5.9 10*3/uL (ref 4.0–10.5)

## 2013-10-27 MED ORDER — CARBAMAZEPINE 200 MG PO TABS: 200.0000 mg | ORAL_TABLET | Freq: Every day | ORAL | Status: DC

## 2013-10-27 MED ORDER — KETOCONAZOLE 2 % EX CREA: TOPICAL_CREAM | Freq: Two times a day (BID) | CUTANEOUS | Status: DC

## 2013-10-27 NOTE — Progress Notes (Signed)
Case discussed with Dr. Brown at the time of the visit.  We reviewed the resident's history and exam and pertinent patient test results.  I agree with the assessment, diagnosis, and plan of care documented in the resident's note. 

## 2013-10-27 NOTE — Assessment & Plan Note (Signed)
Seborrheic dermatitis improved after treatment with ketoconazole in the past, but has not recurred. -Refilled ketoconazole

## 2013-10-27 NOTE — Patient Instructions (Signed)
General Instructions: For your seizures, continue to take Tegretol, 1 tablet once per day.  I've sent in a refill to your pharmacy  We are checking some basic lab work today.  If you don't hear from us, then your lab work was normal.  If anything is abnormal, we will contact you with the results.  Please return for a follow-up visit in 6 months.   Treatment Goals:  Goals (1 Years of Data) as of 10/27/13   None      Progress Toward Treatment Goals:  No flowsheet data found.  Self Care Goals & Plans:  No flowsheet data found.  No flowsheet data found.   Care Management & Community Referrals:  Referral 03/12/2013  Referrals made to community resources none

## 2013-10-27 NOTE — Assessment & Plan Note (Addendum)
The patient has history of cerebral palsy. The patient's family member notes that he has been more active than normal lately, which she has been pleased with. She has only one seizure since her last visit, which is less than usual for him. We discussed the option of seeing a neurologist for her seizure management, but she states that she is happy with his current state of care, though will consider this if his condition worsens. -continue carbamazepine -checking CMP, CBC, given carbamazepine use

## 2013-10-27 NOTE — Progress Notes (Signed)
HPI The patient is a 38 y.o. male with a history of CP with seizures, presenting for a follow-up visit for cerebral palsy.  The patient has only had 1 seizure since our last visit, which occurred 3 weeks ago, in which the patient was found "in a ball" in the living room, with shaking consistent with his typical seizure, and rather speedy recovery from his post-ictal state.  The family notes medication compliance, and no evidence of infection at the time.  The family is pleased with this, stating it is less frequent than usual.  ROS: Unable to obtain from patient, given history of CP.  ROS negative per family members.  Unable to obtain full vitals, due to patient movements O2 saturation: 100%  PEX General: sitting in wheelchair, waving bilateral arms back and forth HEENT: Scalp with area of alopecia, with enlarged follicles around periphery of alopecia.  PERRL.  Unable to examine oropharynx due to patient participation Neck: supple Lungs: clear to ascultation bilaterally, normal work of respiration, no wheezes, rales, ronchi Heart: regular rate and rhythm, no murmurs, gallops, or rubs Abdomen: soft, non-tender, non-distended, normal bowel sounds Extremities: no cyanosis, clubbing, or edema Neurologic: awake, continuously moving during exam, able to express non-verbal noises  Current Outpatient Prescriptions on File Prior to Visit  Medication Sig Dispense Refill  . carbamazepine (TEGRETOL) 200 MG tablet Take 1 tablet (200 mg total) by mouth daily.  90 tablet  3  . ketoconazole (NIZORAL) 2 % cream Apply topically 2 (two) times daily. Apply to flaky skin on face and scalp for about 4 weeks  60 g  1   No current facility-administered medications on file prior to visit.    Assessment/Plan

## 2014-03-31 ENCOUNTER — Other Ambulatory Visit: Payer: Self-pay | Admitting: *Deleted

## 2014-04-01 MED ORDER — CARBAMAZEPINE 200 MG PO TABS: 200.0000 mg | ORAL_TABLET | Freq: Every day | ORAL | Status: DC

## 2014-06-28 ENCOUNTER — Encounter (HOSPITAL_COMMUNITY): Payer: Self-pay | Admitting: Emergency Medicine

## 2014-06-28 ENCOUNTER — Inpatient Hospital Stay (HOSPITAL_COMMUNITY)
Admission: EM | Admit: 2014-06-28 | Discharge: 2014-07-01 | DRG: 481 | Disposition: A | Discharge status: Skilled Nursing Facility | Payer: Medicare Other | Attending: Internal Medicine | Admitting: Internal Medicine

## 2014-06-28 ENCOUNTER — Emergency Department (HOSPITAL_COMMUNITY): Payer: Medicare Other

## 2014-06-28 DIAGNOSIS — S72141S Displaced intertrochanteric fracture of right femur, sequela: Secondary | ICD-10-CM | POA: Diagnosis not present

## 2014-06-28 DIAGNOSIS — G40909 Epilepsy, unspecified, not intractable, without status epilepticus: Secondary | ICD-10-CM

## 2014-06-28 DIAGNOSIS — M6281 Muscle weakness (generalized): Secondary | ICD-10-CM | POA: Diagnosis not present

## 2014-06-28 DIAGNOSIS — D62 Acute posthemorrhagic anemia: Secondary | ICD-10-CM | POA: Diagnosis not present

## 2014-06-28 DIAGNOSIS — R569 Unspecified convulsions: Secondary | ICD-10-CM

## 2014-06-28 DIAGNOSIS — Z993 Dependence on wheelchair: Secondary | ICD-10-CM | POA: Diagnosis not present

## 2014-06-28 DIAGNOSIS — X58XXXA Exposure to other specified factors, initial encounter: Secondary | ICD-10-CM | POA: Diagnosis present

## 2014-06-28 DIAGNOSIS — G809 Cerebral palsy, unspecified: Secondary | ICD-10-CM | POA: Diagnosis not present

## 2014-06-28 DIAGNOSIS — K5900 Constipation, unspecified: Secondary | ICD-10-CM | POA: Diagnosis present

## 2014-06-28 DIAGNOSIS — S72131A Displaced apophyseal fracture of right femur, initial encounter for closed fracture: Secondary | ICD-10-CM | POA: Diagnosis not present

## 2014-06-28 DIAGNOSIS — M79606 Pain in leg, unspecified: Secondary | ICD-10-CM

## 2014-06-28 DIAGNOSIS — Z4789 Encounter for other orthopedic aftercare: Secondary | ICD-10-CM | POA: Diagnosis not present

## 2014-06-28 DIAGNOSIS — S72141A Displaced intertrochanteric fracture of right femur, initial encounter for closed fracture: Secondary | ICD-10-CM | POA: Diagnosis not present

## 2014-06-28 DIAGNOSIS — S7291XA Unspecified fracture of right femur, initial encounter for closed fracture: Secondary | ICD-10-CM | POA: Diagnosis present

## 2014-06-28 DIAGNOSIS — Z23 Encounter for immunization: Secondary | ICD-10-CM

## 2014-06-28 DIAGNOSIS — D649 Anemia, unspecified: Secondary | ICD-10-CM | POA: Diagnosis present

## 2014-06-28 DIAGNOSIS — S72041A Displaced fracture of base of neck of right femur, initial encounter for closed fracture: Secondary | ICD-10-CM | POA: Diagnosis present

## 2014-06-28 DIAGNOSIS — G808 Other cerebral palsy: Secondary | ICD-10-CM | POA: Diagnosis not present

## 2014-06-28 DIAGNOSIS — S72143A Displaced intertrochanteric fracture of unspecified femur, initial encounter for closed fracture: Secondary | ICD-10-CM | POA: Insufficient documentation

## 2014-06-28 DIAGNOSIS — M79651 Pain in right thigh: Secondary | ICD-10-CM | POA: Diagnosis not present

## 2014-06-28 DIAGNOSIS — S72041D Displaced fracture of base of neck of right femur, subsequent encounter for closed fracture with routine healing: Secondary | ICD-10-CM | POA: Diagnosis not present

## 2014-06-28 DIAGNOSIS — G4089 Other seizures: Secondary | ICD-10-CM | POA: Diagnosis not present

## 2014-06-28 DIAGNOSIS — Z419 Encounter for procedure for purposes other than remedying health state, unspecified: Secondary | ICD-10-CM

## 2014-06-28 DIAGNOSIS — S72141D Displaced intertrochanteric fracture of right femur, subsequent encounter for closed fracture with routine healing: Secondary | ICD-10-CM | POA: Diagnosis not present

## 2014-06-28 LAB — CBC WITH DIFFERENTIAL/PLATELET
BASOS ABS: 0 10*3/uL (ref 0.0–0.1)
Basophils Relative: 0 % (ref 0–1)
EOS ABS: 0 10*3/uL (ref 0.0–0.7)
Eosinophils Relative: 0 % (ref 0–5)
HCT: 34.7 % — ABNORMAL LOW (ref 39.0–52.0)
Hemoglobin: 11.8 g/dL — ABNORMAL LOW (ref 13.0–17.0)
LYMPHS ABS: 0.9 10*3/uL (ref 0.7–4.0)
LYMPHS PCT: 5 % — AB (ref 12–46)
MCH: 26.3 pg (ref 26.0–34.0)
MCHC: 34 g/dL (ref 30.0–36.0)
MCV: 77.5 fL — ABNORMAL LOW (ref 78.0–100.0)
Monocytes Absolute: 1.9 10*3/uL — ABNORMAL HIGH (ref 0.1–1.0)
Monocytes Relative: 11 % (ref 3–12)
NEUTROS PCT: 84 % — AB (ref 43–77)
Neutro Abs: 14.6 10*3/uL — ABNORMAL HIGH (ref 1.7–7.7)
PLATELETS: 232 10*3/uL (ref 150–400)
RBC: 4.48 MIL/uL (ref 4.22–5.81)
RDW: 12.8 % (ref 11.5–15.5)
WBC: 17.4 10*3/uL — AB (ref 4.0–10.5)

## 2014-06-28 LAB — URINALYSIS, ROUTINE W REFLEX MICROSCOPIC
Bilirubin Urine: NEGATIVE
Glucose, UA: NEGATIVE mg/dL
HGB URINE DIPSTICK: NEGATIVE
Ketones, ur: NEGATIVE mg/dL
Leukocytes, UA: NEGATIVE
Nitrite: NEGATIVE
Protein, ur: NEGATIVE mg/dL
SPECIFIC GRAVITY, URINE: 1.017 (ref 1.005–1.030)
Urobilinogen, UA: 0.2 mg/dL (ref 0.0–1.0)
pH: 5 (ref 5.0–8.0)

## 2014-06-28 LAB — BASIC METABOLIC PANEL
ANION GAP: 10 (ref 5–15)
BUN: 19 mg/dL (ref 6–23)
CALCIUM: 9.2 mg/dL (ref 8.4–10.5)
CO2: 24 mmol/L (ref 19–32)
Chloride: 106 mEq/L (ref 96–112)
Creatinine, Ser: 1.18 mg/dL (ref 0.50–1.35)
GFR calc Af Amer: 88 mL/min — ABNORMAL LOW (ref >90)
GFR, EST NON AFRICAN AMERICAN: 76 mL/min — AB (ref >90)
GLUCOSE: 138 mg/dL — AB (ref 70–99)
POTASSIUM: 4.2 mmol/L (ref 3.5–5.1)
SODIUM: 140 mmol/L (ref 135–145)

## 2014-06-28 LAB — APTT: aPTT: 30 seconds (ref 24–37)

## 2014-06-28 LAB — PROTIME-INR
INR: 1.27 (ref 0.00–1.49)
Prothrombin Time: 16.1 seconds — ABNORMAL HIGH (ref 11.6–15.2)

## 2014-06-28 MED ORDER — CARBAMAZEPINE 200 MG PO TABS
200.0000 mg | ORAL_TABLET | Freq: Every day | ORAL | Status: DC
  Administered 2014-06-28 – 2014-07-01 (×4): 200 mg via ORAL
  Filled 2014-06-28 (×4): qty 1

## 2014-06-28 MED ORDER — OXYCODONE-ACETAMINOPHEN 5-325 MG PO TABS
1.0000 | ORAL_TABLET | Freq: Three times a day (TID) | ORAL | Status: DC | PRN
  Administered 2014-07-01: 1 via ORAL
  Filled 2014-06-28: qty 1

## 2014-06-28 MED ORDER — SODIUM CHLORIDE 0.9 % IV BOLUS (SEPSIS)
1000.0000 mL | Freq: Once | INTRAVENOUS | Status: AC
  Administered 2014-06-28: 1000 mL via INTRAVENOUS

## 2014-06-28 MED ORDER — SODIUM CHLORIDE 0.9 % IV SOLN
INTRAVENOUS | Status: DC
  Administered 2014-06-28 – 2014-06-29 (×2): via INTRAVENOUS

## 2014-06-28 MED ORDER — ACETAMINOPHEN 650 MG RE SUPP: 650.0000 mg | Freq: Four times a day (QID) | RECTAL | Status: DC | PRN

## 2014-06-28 MED ORDER — HYDROMORPHONE HCL 1 MG/ML IJ SOLN
1.0000 mg | INTRAMUSCULAR | Status: DC | PRN
  Administered 2014-06-28 – 2014-06-29 (×2): 1 mg via INTRAVENOUS
  Filled 2014-06-28 (×2): qty 1

## 2014-06-28 MED ORDER — HEPARIN SODIUM (PORCINE) 5000 UNIT/ML IJ SOLN
5000.0000 [IU] | Freq: Once | INTRAMUSCULAR | Status: AC
  Administered 2014-06-28: 5000 [IU] via SUBCUTANEOUS
  Filled 2014-06-28: qty 1

## 2014-06-28 MED ORDER — ACETAMINOPHEN 325 MG PO TABS: 650.0000 mg | ORAL_TABLET | Freq: Four times a day (QID) | ORAL | Status: DC | PRN

## 2014-06-28 MED ORDER — HYDROMORPHONE HCL 1 MG/ML IJ SOLN
1.0000 mg | Freq: Once | INTRAMUSCULAR | Status: AC
Start: 2014-06-28 — End: 2014-06-28
  Administered 2014-06-28: 1 mg via INTRAVENOUS
  Filled 2014-06-28: qty 1

## 2014-06-28 NOTE — H&P (Signed)
[facility] History and Physical  Austin [name] OZH:[id number] DOB: Sep 17, 1975 DOA: 06/28/2014   Referring physician: ED physician PCP: Farley Ly, MD  Specialists:   Chief Complaint: leg upper leg pain and swelling.  HPI: Austin [name] is a 39 y.o. male with PMH of cerebral palsy, seizures, anemia, who presents with right thigh pain and swelling  Patient is wheelchire bound and does not talk due to cerebral palsy. Per sister, his caregiver, patient was noticed to have right thigh pain and swelling is Saturday morning. Caregiver assumed that patient could have had fall and injured himself, but she did not witnessed the event. Patient has intermittent seizures, but no witnessed seizures recently. Caregiver has not witnessed any other injuries. No  fever, chills, cough, SOB, abdominal pain, diarrhea, dysuria, urgency, frequency, hematuria, skin rashes.   Work up in the ED demonstrates: X-ray of right leg:  Comminuted right femoral intertrochanteric fracture, with basicervical component, and medial rotation of the distal femur and mildly displaced lesser and greater trochanteric fragments. Hemoglobin decreased from previous of 14.5 on 10/27/13 to 11.8. Leukocytosis with WBC 17.4. Patient is admitted to inpatient for further evaluation and treatment. Orthopedic surgeon was consulted by ED.  Review of Systems: As presented in the history of presenting illness, rest negative.  Where does patient live? At home with sister who is caregiver   Can patient participate in ADLs? None  Allergy: No Known Allergies  Past Medical History  Diagnosis Date  . Nonspecific elevation of levels of transaminase and lactic acid dehydrogenase (LDH)   . Seizure disorder   . Cerebral palsy     Past Surgical History  Procedure Laterality Date  . I&d extremity  09/16/2011    Procedure: IRRIGATION AND DEBRIDEMENT EXTREMITY;  Surgeon: Tami Ribas, MD;  Location: [facility] OR;  Service: Orthopedics;  Laterality:  Left;  Left  . I&d extremity  09/17/2011    Procedure: IRRIGATION AND DEBRIDEMENT EXTREMITY;  Surgeon: Tami Ribas, MD;  Location: [facility] OR;  Service: Orthopedics;  Laterality: Left;  Irrigation and debridement of left hand     Social History:  reports that he has never smoked. He does not have any smokeless tobacco history on file. He reports that he does not drink alcohol or use illicit drugs.  Family History: History reviewed. No pertinent family history.   Prior to Admission medications   Medication Sig Start Date End Date Taking? Authorizing Provider  carbamazepine (TEGRETOL) 200 MG tablet Take 1 tablet (200 mg total) by mouth daily. 04/01/14   Lorenda Hatchet, MD  ketoconazole (NIZORAL) 2 % cream Apply topically 2 (two) times daily. Apply to flaky skin on face and scalp for about 4 weeks 10/27/13   Linward Headland, MD    Physical Exam: Filed Vitals:   06/28/14 1945 06/28/14 1959 06/28/14 2000 06/28/14 2015  BP: 121/97  126/82 144/85  Pulse: 103  96 89  Temp:  99.5 F (37.5 C)    TempSrc:  Rectal    Resp: SpO2: 100%  97% 93%   General: Not in acute distress HEENT:       Eyes: PERRL, EOMI, no scleral icterus       ENT: No discharge from the ears and nose, no pharynx injection, no tonsillar enlargement.        Neck: No JVD, no bruit, no mass felt. Cardiac: S1/S2, RRR, No murmurs, No gallops or rubs Pulm: Good air movement bilaterally. Clear to auscultation bilaterally. No rales, wheezing,  rhonchi or rubs. Abd: Soft, nondistended, nontender, no rebound pain, no organomegaly, BS present Ext: No edema bilaterally. 2+DP/PT pulse bilaterally Musculoskeletal: Patient has moderate swelling over his anterior and lateral proximal right thigh,  pain to palpation. 2+ pulses distal extremity,  Skin: No rashes.  Neuro: Alert, nonverbal, flexion bilateral hips and knees, Cerebral palsy   Psych: Patient is not psychotic, no suicidal or hemocidal ideation.  Labs on Admission:  Basic  Metabolic Panel:  Recent Labs Lab 06/28/14 1852  NA 140  K 4.2  CL 106  CO2 24  GLUCOSE 138*  BUN 19  CREATININE 1.18  CALCIUM 9.2   Liver Function Tests: No results for input(s): AST, ALT, ALKPHOS, BILITOT, PROT, ALBUMIN in the last 168 hours. No results for input(s): LIPASE, AMYLASE in the last 168 hours. No results for input(s): AMMONIA in the last 168 hours. CBC:  Recent Labs Lab 06/28/14 1852  WBC 17.4*  NEUTROABS 14.6*  HGB 11.8*  HCT 34.7*  MCV 77.5*  PLT 232   Cardiac Enzymes: No results for input(s): CKTOTAL, CKMB, CKMBINDEX, TROPONINI in the last 168 hours.  BNP (last 3 results) No results for input(s): PROBNP in the last 8760 hours. CBG: No results for input(s): GLUCAP in the last 168 hours.  Radiological Exams on Admission: Dg Pelvis 1-2 Views  06/28/2014   CLINICAL DATA:  Acute onset of right leg pain and swelling. Initial encounter.  EXAM: PELVIS - 1-2 VIEW  COMPARISON:  None.  FINDINGS: There is a comminuted right femoral intertrochanteric fracture, with a basicervical component, and medial rotation of the distal femur. Mildly displaced lesser and greater trochanteric fragments are seen. The right femoral head remains seated at the acetabulum. The left hip joint is grossly unremarkable. The sacroiliac joints are within normal limits. There is somewhat unusual marked soft tissue swelling along the proximal right thigh; would correlate clinically to exclude compartment syndrome.  The large amount of stool is noted filling the rectum, measuring 10.9 cm in transverse dimension, raising concern for fecal impaction. However, the remainder of the visualized bowel gas pattern is grossly unremarkable.  IMPRESSION: 1. Comminuted right femoral intertrochanteric fracture, with a basicervical component, and medial rotation of the distal femur. Mildly displaced lesser and greater trochanteric fragments seen. 2. Somewhat unusual marked soft tissue swelling along the proximal  right thigh; would correlate clinically to exclude compartment syndrome. 3. Large amount of stool noted filling the rectum, measuring 10.9 cm in transverse dimension, raising concern for fecal impaction. However, the remainder of the visualized bowel gas pattern is grossly unremarkable. These results were called by telephone at the time of interpretation on 06/28/2014 at 6:35 pm to the ER physician, who verbally acknowledged these results.   Electronically Signed   By: Roanna Raider M.D.   On: 06/28/2014 18:35   Dg Femur Right  06/28/2014   CLINICAL DATA:  Seizure disorder and cerebral palsy. Right hip swelling.  EXAM: RIGHT FEMUR - 2 VIEW  COMPARISON:  None.  FINDINGS: There is an acute and comminuted fracture of the proximal right femur with displacement and angulation. The fracture plane is predominantly intertrochanteric. There is an oblique component that extends through the subtrochanteric proximal diaphysis. No dislocation. No underlying bony lesion is seen.  IMPRESSION: Acute and comminuted fracture of the proximal right femur which is primarily an intertrochanteric fracture. There is an oblique plane that does extend through the proximal diaphysis, as well, inferior to the trochanters.   Electronically Signed   By: Irish Lack  M.D.   On: 06/28/2014 18:31    EKG: will get one  Assessment/Plan Principal Problem:   Femur fracture, right Active Problems:   Seizure   Anemia  Femur fracture-right: ray of right leg:  Comminuted right femoral intertrochanteric fracture, with basicervical component, and medial rotation of the distal femur and mildly displaced lesser and greater trochanteric fragments. Orthopedic surgeon was consulted, Dr. Roda Shutters pans to do surgery tomorrow.  - will admit to med-surg bed - Pain control: percocet and morphine prn - follow up ortho recs - NPO after MN - type and cross - INR/PTT  Seizure: Stable -Continue Tegretol  Anemia: Hemoglobin dropped from 14.5 on 10/27/13  to 11.8. It is likely due to bleeding after bone fracture. -check CBC at 1:00 AM and in AM   DVT ppx: - give one dose of sq heparin and SCD per Dr. Roda Shutters  Code Status: Full code Family Communication:  Yes, patient's  sister     at bed side Disposition Plan: Admit to inpatient   Date of Service 06/28/2014    Lorretta Harp [facility] Pager [phone]  If 7PM-7AM, please contact night-coverage [email or web address] Password [facility] 06/28/2014, 8:28 PM

## 2014-06-28 NOTE — Progress Notes (Signed)
Pt sister requested bed side rails up for safety of the patient. Suction machine set up. Seizure and fall precaution emphasized.

## 2014-06-28 NOTE — ED Provider Notes (Signed)
CSN: [id number]     Arrival date & time 06/28/14  1643 History   First MD Initiated Contact with Patient 06/28/14 1843     Chief Complaint  Patient presents with  . Leg Pain     (Consider location/radiation/quality/duration/timing/severity/associated sxs/prior Treatment) HPI Comments: 39 year old male with cerebral palsy, seizures, anemia history presents with right thigh pain and swelling noticed for the past 2 days. Caregiver based patient yesterday noticed some discomfort with bathing, she felt it was likely related to his recurrent constipation and treated as such. Caregiver noticed today that patient had worsening discomfort and swelling to that thigh on the right. Patient has intermittent seizures no witnessed seizures recently, no witnessed falls or injuries. Patient nonverbal however signs of pain per caregiver. Caregiver his primary taking care of patient and has not witnessed any other injuries.  Patient is a 39 y.o. male presenting with leg pain. The history is provided by a caregiver.  Leg Pain   Past Medical History  Diagnosis Date  . Nonspecific elevation of levels of transaminase and lactic acid dehydrogenase (LDH)   . Seizure disorder   . Cerebral palsy    Past Surgical History  Procedure Laterality Date  . I&d extremity  09/16/2011    Procedure: IRRIGATION AND DEBRIDEMENT EXTREMITY;  Surgeon: Tami Ribas, MD;  Location: [facility] OR;  Service: Orthopedics;  Laterality: Left;  Left  . I&d extremity  09/17/2011    Procedure: IRRIGATION AND DEBRIDEMENT EXTREMITY;  Surgeon: Tami Ribas, MD;  Location: [facility] OR;  Service: Orthopedics;  Laterality: Left;  Irrigation and debridement of left hand    History reviewed. No pertinent family history. History  Substance Use Topics  . Smoking status: Never Smoker   . Smokeless tobacco: Not on file  . Alcohol Use: No    Review of Systems  Unable to perform ROS: Patient nonverbal      Allergies  Review of patient's allergies  indicates no known allergies.  Home Medications   Prior to Admission medications   Medication Sig Start Date End Date Taking? Authorizing Provider  carbamazepine (TEGRETOL) 200 MG tablet Take 1 tablet (200 mg total) by mouth daily. 04/01/14   Lorenda Hatchet, MD  ketoconazole (NIZORAL) 2 % cream Apply topically 2 (two) times daily. Apply to flaky skin on face and scalp for about 4 weeks 10/27/13   Linward Headland, MD   BP 146/88 mmHg  Pulse 80  Temp(Src)   Resp 14  SpO2 97% Physical Exam  Constitutional: He appears well-developed. No distress.  HENT:  Head: Normocephalic and atraumatic.  Dry mucous membranes  Eyes: Pupils are equal, round, and reactive to light. Right eye exhibits no discharge. Left eye exhibits no discharge.  Neck: Neck supple.  Cardiovascular: Normal rate.   Pulmonary/Chest: Effort normal. No respiratory distress. He has no wheezes. He has no rales.  Abdominal: Soft. He exhibits no distension. There is no tenderness.  Musculoskeletal: He exhibits edema and tenderness.  Patient has moderate swelling anterior lateral proximal right thigh, 2+ pulses distal extremity, pain to palpation. No swelling or signs of tenderness to tibia region.  Neurological: He is alert.  Nonverbal, flexion bilateral hips and knees Cerebral palsy  Skin: Skin is warm.  Psychiatric:  CP  Vitals reviewed.   ED Course  Procedures (including critical care time) Labs Review Labs Reviewed  CBC WITH DIFFERENTIAL - Abnormal; Notable for the following:    WBC 17.4 (*)    Hemoglobin 11.8 (*)    HCT  34.7 (*)    MCV 77.5 (*)    Neutrophils Relative % 84 (*)    Neutro Abs 14.6 (*)    Lymphocytes Relative 5 (*)    Monocytes Absolute 1.9 (*)    All other components within normal limits  BASIC METABOLIC PANEL - Abnormal; Notable for the following:    Glucose, Bld 138 (*)    GFR calc non Af Amer 76 (*)    GFR calc Af Amer 88 (*)    All other components within normal limits  URINE CULTURE   URINALYSIS, ROUTINE W REFLEX MICROSCOPIC    Imaging Review Dg Pelvis 1-2 Views  06/28/2014   CLINICAL DATA:  Acute onset of right leg pain and swelling. Initial encounter.  EXAM: PELVIS - 1-2 VIEW  COMPARISON:  None.  FINDINGS: There is a comminuted right femoral intertrochanteric fracture, with a basicervical component, and medial rotation of the distal femur. Mildly displaced lesser and greater trochanteric fragments are seen. The right femoral head remains seated at the acetabulum. The left hip joint is grossly unremarkable. The sacroiliac joints are within normal limits. There is somewhat unusual marked soft tissue swelling along the proximal right thigh; would correlate clinically to exclude compartment syndrome.  The large amount of stool is noted filling the rectum, measuring 10.9 cm in transverse dimension, raising concern for fecal impaction. However, the remainder of the visualized bowel gas pattern is grossly unremarkable.  IMPRESSION: 1. Comminuted right femoral intertrochanteric fracture, with a basicervical component, and medial rotation of the distal femur. Mildly displaced lesser and greater trochanteric fragments seen. 2. Somewhat unusual marked soft tissue swelling along the proximal right thigh; would correlate clinically to exclude compartment syndrome. 3. Large amount of stool noted filling the rectum, measuring 10.9 cm in transverse dimension, raising concern for fecal impaction. However, the remainder of the visualized bowel gas pattern is grossly unremarkable. These results were called by telephone at the time of interpretation on 06/28/2014 at 6:35 pm to the ER physician, who verbally acknowledged these results.   Electronically Signed   By: Roanna Raider M.D.   On: 06/28/2014 18:35   Dg Femur Right  06/28/2014   CLINICAL DATA:  Seizure disorder and cerebral palsy. Right hip swelling.  EXAM: RIGHT FEMUR - 2 VIEW  COMPARISON:  None.  FINDINGS: There is an acute and comminuted fracture  of the proximal right femur with displacement and angulation. The fracture plane is predominantly intertrochanteric. There is an oblique component that extends through the subtrochanteric proximal diaphysis. No dislocation. No underlying bony lesion is seen.  IMPRESSION: Acute and comminuted fracture of the proximal right femur which is primarily an intertrochanteric fracture. There is an oblique plane that does extend through the proximal diaphysis, as well, inferior to the trochanters.   Electronically Signed   By: Irish Lack M.D.   On: 06/28/2014 18:31     EKG Interpretation None     EKG reviewed sinus, heart rate 83, prolonged QT, nl axis MDM   Final diagnoses:  Closed comminuted intertrochanteric fracture of proximal femur, right, initial encounter  Cerebral palsy   Patient presents with unwitnessed injury and leg pain. X-ray reviewed on the radiologist called with significant abnormalities comminuted proximal femur fracture. X-ray reviewed [facility], pain medicines ordered. Blood work ordered, EKG preop ordered. Plan for admission for orthopedic surgery.  Spoke with orthopedics Dr. Roda Shutters, spoke with [facility] for [facility]. The patients results and plan were reviewed and discussed.   Any x-rays performed were personally reviewed  by myself.   Differential diagnosis were considered with the presenting HPI.  Medications  sodium chloride 0.9 % bolus 1,000 mL (not administered)  HYDROmorphone (DILAUDID) injection 1 mg (1 mg Intravenous Given 06/28/14 1907)    Filed Vitals:   06/28/14 1656 06/28/14 1915  BP: 137/70 146/88  Pulse: 95 80  Resp: 18 14  SpO2: 99% 97%    Final diagnoses:  Closed comminuted intertrochanteric fracture of proximal femur, right, initial encounter  Cerebral palsy    Admission/ observation were discussed with the admitting physician, patient and/or family and they are comfortable with the plan.      Enid Skeens, MD 06/29/14 [phone]

## 2014-06-28 NOTE — ED Notes (Signed)
Pt c/o right upper leg; no obvious injury per caregiver; pt sts noticed yesterday; pt with hx of CP

## 2014-06-28 NOTE — ED Notes (Signed)
Dr. Clyde Lundborg and Dr. Roda Shutters at bedside

## 2014-06-28 NOTE — Consult Note (Signed)
ORTHOPAEDIC CONSULTATION  REQUESTING PHYSICIAN: Lorretta Harp, MD  Chief Complaint: right femur fx  HPI: Austin [name] is a 39 y.o. male with CP and sz who is nonverbal and nonambulatory was found to have a right pertrochanteric femur fx.  Details are unclear.  Per sister who is caregiver, pt may have had a seizure that led to the fall and injury.  Ortho consulted.  Past Medical History  Diagnosis Date  . Nonspecific elevation of levels of transaminase and lactic acid dehydrogenase (LDH)   . Seizure disorder   . Cerebral palsy    Past Surgical History  Procedure Laterality Date  . I&d extremity  09/16/2011    Procedure: IRRIGATION AND DEBRIDEMENT EXTREMITY;  Surgeon: Tami Ribas, MD;  Location[facility];  Service: Orthopedics;  Laterality: Left;  Left  . I&d extremity  09/17/2011    Procedure: IRRIGATION AND DEBRIDEMENT EXTREMITY;  Surgeon: Tami Ribas, MD;  Location: [facility] OR;  Service: Orthopedics;  Laterality: Left;  Irrigation and debridement of left hand    History   Social History  . Marital Status: Single    Spouse Name: N/A    Number of Children: N/A  . Years of Education: N/A   Social History Main Topics  . Smoking status: Never Smoker   . Smokeless tobacco: None  . Alcohol Use: No  . Drug Use: No  . Sexual Activity: None   Other Topics Concern  . None   Social History Narrative   Sister is primary caregiver.    History reviewed. No pertinent family history. No Known Allergies Prior to Admission medications   Medication Sig Start Date End Date Taking? Authorizing Provider  carbamazepine (TEGRETOL) 200 MG tablet Take 1 tablet (200 mg total) by mouth daily. 04/01/14   Lorenda Hatchet, MD  ketoconazole (NIZORAL) 2 % cream Apply topically 2 (two) times daily. Apply to flaky skin on face and scalp for about 4 weeks 10/27/13   Linward Headland, MD   Dg Pelvis 1-2 Views  06/28/2014   CLINICAL DATA:  Acute onset of right leg pain and swelling. Initial encounter.  EXAM: PELVIS  - 1-2 VIEW  COMPARISON:  None.  FINDINGS: There is a comminuted right femoral intertrochanteric fracture, with a basicervical component, and medial rotation of the distal femur. Mildly displaced lesser and greater trochanteric fragments are seen. The right femoral head remains seated at the acetabulum. The left hip joint is grossly unremarkable. The sacroiliac joints are within normal limits. There is somewhat unusual marked soft tissue swelling along the proximal right thigh; would correlate clinically to exclude compartment syndrome.  The large amount of stool is noted filling the rectum, measuring 10.9 cm in transverse dimension, raising concern for fecal impaction. However, the remainder of the visualized bowel gas pattern is grossly unremarkable.  IMPRESSION: 1. Comminuted right femoral intertrochanteric fracture, with a basicervical component, and medial rotation of the distal femur. Mildly displaced lesser and greater trochanteric fragments seen. 2. Somewhat unusual marked soft tissue swelling along the proximal right thigh; would correlate clinically to exclude compartment syndrome. 3. Large amount of stool noted filling the rectum, measuring 10.9 cm in transverse dimension, raising concern for fecal impaction. However, the remainder of the visualized bowel gas pattern is grossly unremarkable. These results were called by telephone at the time of interpretation on 06/28/2014 at 6:35 pm to the ER physician, who verbally acknowledged these results.   Electronically Signed   By: Roanna Raider M.D.   On: 06/28/2014  18:35   Dg Femur Right  06/28/2014   CLINICAL DATA:  Seizure disorder and cerebral palsy. Right hip swelling.  EXAM: RIGHT FEMUR - 2 VIEW  COMPARISON:  None.  FINDINGS: There is an acute and comminuted fracture of the proximal right femur with displacement and angulation. The fracture plane is predominantly intertrochanteric. There is an oblique component that extends through the subtrochanteric  proximal diaphysis. No dislocation. No underlying bony lesion is seen.  IMPRESSION: Acute and comminuted fracture of the proximal right femur which is primarily an intertrochanteric fracture. There is an oblique plane that does extend through the proximal diaphysis, as well, inferior to the trochanters.   Electronically Signed   By: Irish Lack M.D.   On: 06/28/2014 18:31    Positive ROS: All other systems have been reviewed and were otherwise negative with the exception of those mentioned in the HPI and as above.  Physical Exam: General: Alert, no acute distress Cardiovascular: No pedal edema Respiratory: No cyanosis, no use of accessory musculature GI: No organomegaly, abdomen is soft and non-tender Skin: No lesions in the area of chief complaint Neurologic: Sensation intact distally Psychiatric: Patient is competent for consent with normal mood and affect Lymphatic: No axillary or cervical lymphadenopathy  MUSCULOSKELETAL:  - hip and knee flexed but can be passively corrected to neutral - thigh with appropriate swelling, no signs of compartment syndrome - foot wwp -   Assessment: Right femur pertroch  Plan: - NPO after midnight - 1 dose of subq heparin ok - plan for surgery tomorrow - discussed r/b/a with sister and agrees to proceed  Thank you for the consult and the opportunity to see Mr. Wayburn Shaler. Glee Arvin, MD [facility] [phone] 8:24 PM

## 2014-06-29 ENCOUNTER — Inpatient Hospital Stay (HOSPITAL_COMMUNITY): Payer: Medicare Other | Admitting: Anesthesiology

## 2014-06-29 ENCOUNTER — Encounter (HOSPITAL_COMMUNITY)
Admission: EM | Disposition: A | Discharge status: Skilled Nursing Facility | Payer: Self-pay | Source: Home / Self Care | Attending: Internal Medicine

## 2014-06-29 ENCOUNTER — Encounter (HOSPITAL_COMMUNITY): Payer: Self-pay | Admitting: Anesthesiology

## 2014-06-29 ENCOUNTER — Inpatient Hospital Stay (HOSPITAL_COMMUNITY): Payer: Medicare Other

## 2014-06-29 DIAGNOSIS — S7291XA Unspecified fracture of right femur, initial encounter for closed fracture: Secondary | ICD-10-CM

## 2014-06-29 HISTORY — PX: INTRAMEDULLARY (IM) NAIL INTERTROCHANTERIC: SHX5875

## 2014-06-29 LAB — CBC
HEMATOCRIT: 28.3 % — AB (ref 39.0–52.0)
Hemoglobin: 9.4 g/dL — ABNORMAL LOW (ref 13.0–17.0)
MCH: 25.8 pg — ABNORMAL LOW (ref 26.0–34.0)
MCHC: 33.2 g/dL (ref 30.0–36.0)
MCV: 77.7 fL — ABNORMAL LOW (ref 78.0–100.0)
PLATELETS: 168 10*3/uL (ref 150–400)
RBC: 3.64 MIL/uL — ABNORMAL LOW (ref 4.22–5.81)
RDW: 13 % (ref 11.5–15.5)
WBC: 13.9 10*3/uL — AB (ref 4.0–10.5)

## 2014-06-29 LAB — BASIC METABOLIC PANEL
ANION GAP: 5 (ref 5–15)
BUN: 15 mg/dL (ref 6–23)
CO2: 24 mmol/L (ref 19–32)
Calcium: 8 mg/dL — ABNORMAL LOW (ref 8.4–10.5)
Chloride: 114 mEq/L — ABNORMAL HIGH (ref 96–112)
Creatinine, Ser: 0.96 mg/dL (ref 0.50–1.35)
GFR calc non Af Amer: >90 mL/min (ref >90)
Glucose, Bld: 107 mg/dL — ABNORMAL HIGH (ref 70–99)
POTASSIUM: 4.4 mmol/L (ref 3.5–5.1)
SODIUM: 143 mmol/L (ref 135–145)

## 2014-06-29 LAB — GLUCOSE, CAPILLARY: GLUCOSE-CAPILLARY: 80 mg/dL (ref 70–99)

## 2014-06-29 LAB — ABO/RH: ABO/RH(D): B POS

## 2014-06-29 LAB — URINE CULTURE
Colony Count: NO GROWTH
Culture: NO GROWTH

## 2014-06-29 SURGERY — FIXATION, FRACTURE, INTERTROCHANTERIC, WITH INTRAMEDULLARY ROD
Anesthesia: General | Laterality: Right

## 2014-06-29 MED ORDER — SORBITOL 70 % SOLN: 30.0000 mL | Freq: Every day | Status: DC | PRN

## 2014-06-29 MED ORDER — ASPIRIN EC 325 MG PO TBEC: 325.0000 mg | DELAYED_RELEASE_TABLET | Freq: Two times a day (BID) | ORAL | Status: DC

## 2014-06-29 MED ORDER — SUCCINYLCHOLINE CHLORIDE 20 MG/ML IJ SOLN
INTRAMUSCULAR | Status: DC | PRN
  Administered 2014-06-29: 100 mg via INTRAVENOUS

## 2014-06-29 MED ORDER — CEFAZOLIN SODIUM-DEXTROSE 2-3 GM-% IV SOLR
2.0000 g | Freq: Four times a day (QID) | INTRAVENOUS | Status: AC
  Administered 2014-06-30 (×3): 2 g via INTRAVENOUS
  Filled 2014-06-29 (×4): qty 50

## 2014-06-29 MED ORDER — ONDANSETRON HCL 4 MG/2ML IJ SOLN: 4.0000 mg | Freq: Once | INTRAMUSCULAR | Status: AC | PRN

## 2014-06-29 MED ORDER — FENTANYL CITRATE 0.05 MG/ML IJ SOLN
INTRAMUSCULAR | Status: DC | PRN
  Administered 2014-06-29 (×2): 100 ug via INTRAVENOUS
  Administered 2014-06-29: 50 ug via INTRAVENOUS

## 2014-06-29 MED ORDER — HYDROMORPHONE HCL 1 MG/ML IJ SOLN: 0.2500 mg | INTRAMUSCULAR | Status: DC | PRN

## 2014-06-29 MED ORDER — MIDAZOLAM HCL 2 MG/2ML IJ SOLN
INTRAMUSCULAR | Status: AC
  Filled 2014-06-29: qty 2

## 2014-06-29 MED ORDER — NEOSTIGMINE METHYLSULFATE 10 MG/10ML IV SOLN
INTRAVENOUS | Status: DC | PRN
  Administered 2014-06-29: 4 mg via INTRAVENOUS

## 2014-06-29 MED ORDER — METHOCARBAMOL 1000 MG/10ML IJ SOLN
500.0000 mg | Freq: Four times a day (QID) | INTRAVENOUS | Status: DC | PRN
  Filled 2014-06-29: qty 5

## 2014-06-29 MED ORDER — METHOCARBAMOL 500 MG PO TABS: 500.0000 mg | ORAL_TABLET | Freq: Four times a day (QID) | ORAL | Status: DC | PRN

## 2014-06-29 MED ORDER — HYDROCODONE-ACETAMINOPHEN 5-325 MG PO TABS: 1.0000 | ORAL_TABLET | Freq: Four times a day (QID) | ORAL | Status: DC | PRN

## 2014-06-29 MED ORDER — PROPOFOL 10 MG/ML IV BOLUS
INTRAVENOUS | Status: DC | PRN
  Administered 2014-06-29: 150 mg via INTRAVENOUS

## 2014-06-29 MED ORDER — CEFAZOLIN SODIUM-DEXTROSE 2-3 GM-% IV SOLR
INTRAVENOUS | Status: DC | PRN
  Administered 2014-06-29: 2 g via INTRAVENOUS

## 2014-06-29 MED ORDER — SODIUM CHLORIDE 0.9 % IV SOLN
INTRAVENOUS | Status: DC
  Administered 2014-06-30: 05:00:00 via INTRAVENOUS

## 2014-06-29 MED ORDER — LIDOCAINE HCL (CARDIAC) 20 MG/ML IV SOLN
INTRAVENOUS | Status: DC | PRN
  Administered 2014-06-29: 50 mg via INTRAVENOUS

## 2014-06-29 MED ORDER — ONDANSETRON HCL 4 MG/2ML IJ SOLN
INTRAMUSCULAR | Status: DC | PRN
  Administered 2014-06-29: 4 mg via INTRAVENOUS

## 2014-06-29 MED ORDER — FENTANYL CITRATE 0.05 MG/ML IJ SOLN
INTRAMUSCULAR | Status: AC
Start: 2014-06-29 — End: 2014-06-29
  Filled 2014-06-29: qty 5

## 2014-06-29 MED ORDER — OXYCODONE HCL 5 MG PO TABS: 5.0000 mg | ORAL_TABLET | ORAL | Status: DC | PRN

## 2014-06-29 MED ORDER — ALUM & MAG HYDROXIDE-SIMETH 200-200-20 MG/5ML PO SUSP: 30.0000 mL | ORAL | Status: DC | PRN

## 2014-06-29 MED ORDER — ACETAMINOPHEN 650 MG RE SUPP: 650.0000 mg | Freq: Four times a day (QID) | RECTAL | Status: DC | PRN

## 2014-06-29 MED ORDER — FENTANYL CITRATE 0.05 MG/ML IJ SOLN
INTRAMUSCULAR | Status: AC
  Filled 2014-06-29: qty 5

## 2014-06-29 MED ORDER — KETOROLAC TROMETHAMINE 30 MG/ML IJ SOLN
INTRAMUSCULAR | Status: AC
  Filled 2014-06-29: qty 1

## 2014-06-29 MED ORDER — ROCURONIUM BROMIDE 100 MG/10ML IV SOLN
INTRAVENOUS | Status: DC | PRN
  Administered 2014-06-29: 20 mg via INTRAVENOUS
  Administered 2014-06-29: 10 mg via INTRAVENOUS

## 2014-06-29 MED ORDER — KETOROLAC TROMETHAMINE 30 MG/ML IJ SOLN
30.0000 mg | Freq: Four times a day (QID) | INTRAMUSCULAR | Status: DC | PRN
  Administered 2014-06-29 – 2014-06-30 (×2): 30 mg via INTRAVENOUS
  Filled 2014-06-29: qty 1

## 2014-06-29 MED ORDER — ONDANSETRON HCL 4 MG/2ML IJ SOLN: 4.0000 mg | Freq: Four times a day (QID) | INTRAMUSCULAR | Status: DC | PRN

## 2014-06-29 MED ORDER — GLYCOPYRROLATE 0.2 MG/ML IJ SOLN
INTRAMUSCULAR | Status: DC | PRN
  Administered 2014-06-29: .7 mg via INTRAVENOUS

## 2014-06-29 MED ORDER — MAGNESIUM CITRATE PO SOLN: 1.0000 | Freq: Once | ORAL | Status: AC | PRN

## 2014-06-29 MED ORDER — ASPIRIN EC 325 MG PO TBEC
325.0000 mg | DELAYED_RELEASE_TABLET | Freq: Two times a day (BID) | ORAL | Status: DC
  Administered 2014-06-29 – 2014-07-01 (×4): 325 mg via ORAL
  Filled 2014-06-29 (×6): qty 1

## 2014-06-29 MED ORDER — 0.9 % SODIUM CHLORIDE (POUR BTL) OPTIME
TOPICAL | Status: DC | PRN
Start: 2014-06-29 — End: 2014-06-29
  Administered 2014-06-29: 1000 mL

## 2014-06-29 MED ORDER — POLYETHYLENE GLYCOL 3350 17 G PO PACK: 17.0000 g | PACK | Freq: Every day | ORAL | Status: DC | PRN

## 2014-06-29 MED ORDER — MORPHINE SULFATE 2 MG/ML IJ SOLN: 0.5000 mg | INTRAMUSCULAR | Status: DC | PRN

## 2014-06-29 MED ORDER — ONDANSETRON HCL 4 MG PO TABS: 4.0000 mg | ORAL_TABLET | Freq: Four times a day (QID) | ORAL | Status: DC | PRN

## 2014-06-29 MED ORDER — LACTATED RINGERS IV SOLN
INTRAVENOUS | Status: DC
  Administered 2014-06-29: 14:00:00 via INTRAVENOUS

## 2014-06-29 MED ORDER — METOCLOPRAMIDE HCL 10 MG PO TABS: 5.0000 mg | ORAL_TABLET | Freq: Three times a day (TID) | ORAL | Status: DC | PRN

## 2014-06-29 MED ORDER — SENNA 8.6 MG PO TABS
1.0000 | ORAL_TABLET | Freq: Two times a day (BID) | ORAL | Status: DC
  Administered 2014-06-29 – 2014-07-01 (×3): 8.6 mg via ORAL
  Filled 2014-06-29 (×5): qty 1

## 2014-06-29 MED ORDER — LACTATED RINGERS IV SOLN
INTRAVENOUS | Status: DC | PRN
  Administered 2014-06-29 (×2): via INTRAVENOUS

## 2014-06-29 MED ORDER — METOCLOPRAMIDE HCL 5 MG/ML IJ SOLN: 5.0000 mg | Freq: Three times a day (TID) | INTRAMUSCULAR | Status: DC | PRN

## 2014-06-29 MED ORDER — PHENOL 1.4 % MT LIQD: 1.0000 | OROMUCOSAL | Status: DC | PRN

## 2014-06-29 MED ORDER — ACETAMINOPHEN 325 MG PO TABS: 650.0000 mg | ORAL_TABLET | Freq: Four times a day (QID) | ORAL | Status: DC | PRN

## 2014-06-29 MED ORDER — MENTHOL 3 MG MT LOZG: 1.0000 | LOZENGE | OROMUCOSAL | Status: DC | PRN

## 2014-06-29 SURGICAL SUPPLY — 55 items
BIT DRILL LONG 4.0MM (BIT) IMPLANT
BIT DRILL SHORT 4.0 (BIT) IMPLANT
BLADE SURG 15 STRL LF DISP TIS (BLADE) ×1 IMPLANT
BLADE SURG 15 STRL SS (BLADE) ×3
BNDG COHESIVE 4X5 TAN NS LF (GAUZE/BANDAGES/DRESSINGS) ×3 IMPLANT
BNDG COHESIVE 6X5 TAN STRL LF (GAUZE/BANDAGES/DRESSINGS) ×4 IMPLANT
BNDG GAUZE ELAST 4 BULKY (GAUZE/BANDAGES/DRESSINGS) ×3 IMPLANT
COVER PERINEAL POST (MISCELLANEOUS) ×3 IMPLANT
COVER SURGICAL LIGHT HANDLE (MISCELLANEOUS) ×5 IMPLANT
DRAPE C-ARMOR (DRAPES) ×2 IMPLANT
DRAPE INCISE IOBAN 66X45 STRL (DRAPES) ×2 IMPLANT
DRAPE PROXIMA HALF (DRAPES) IMPLANT
DRAPE STERI IOBAN 125X83 (DRAPES) ×3 IMPLANT
DRILL BIT LONG 4.0MM (BIT) ×6
DRILL BIT SHORT 4.0 (BIT) ×6
DRILL STEP  6.4 (BIT) ×2 IMPLANT
DRSG MEPILEX BORDER 4X4 (GAUZE/BANDAGES/DRESSINGS) ×7 IMPLANT
DRSG MEPILEX BORDER 4X8 (GAUZE/BANDAGES/DRESSINGS) ×3 IMPLANT
DRSG PAD ABDOMINAL 8X10 ST (GAUZE/BANDAGES/DRESSINGS) ×6 IMPLANT
DURAPREP 26ML APPLICATOR (WOUND CARE) ×3 IMPLANT
ELECT CAUTERY BLADE 6.4 (BLADE) ×3 IMPLANT
ELECT REM PT RETURN 9FT ADLT (ELECTROSURGICAL) ×3
ELECTRODE REM PT RTRN 9FT ADLT (ELECTROSURGICAL) ×1 IMPLANT
FACESHIELD WRAPAROUND (MASK) ×3 IMPLANT
FACESHIELD WRAPAROUND OR TEAM (MASK) ×1 IMPLANT
GAUZE XEROFORM 5X9 LF (GAUZE/BANDAGES/DRESSINGS) ×3 IMPLANT
GLOVE NEODERM STRL 7.5 LF PF (GLOVE) ×2 IMPLANT
GLOVE SURG NEODERM 7.5  LF PF (GLOVE) ×4
GOWN STRL REIN XL XLG (GOWN DISPOSABLE) ×3 IMPLANT
GUIDE PIN 3.2MM (MISCELLANEOUS) ×3
GUIDE PIN ORTH 343X3.2XBRAD (MISCELLANEOUS) IMPLANT
GUIDE ROD 3.0 (MISCELLANEOUS) ×3
KIT BASIN OR (CUSTOM PROCEDURE TRAY) ×3 IMPLANT
KIT ROOM TURNOVER OR (KITS) ×3 IMPLANT
LINER BOOT UNIVERSAL DISP (MISCELLANEOUS) ×3 IMPLANT
MANIFOLD NEPTUNE II (INSTRUMENTS) ×3 IMPLANT
NAIL FEM 10X36 R (Nail) ×2 IMPLANT
NS IRRIG 1000ML POUR BTL (IV SOLUTION) ×3 IMPLANT
PACK GENERAL/GYN (CUSTOM PROCEDURE TRAY) ×3 IMPLANT
PAD ARMBOARD 7.5X6 YLW CONV (MISCELLANEOUS) ×6 IMPLANT
PAD CAST 4YDX4 CTTN HI CHSV (CAST SUPPLIES) ×2 IMPLANT
PADDING CAST COTTON 4X4 STRL (CAST SUPPLIES) ×6
ROD GUIDE 3.0 (MISCELLANEOUS) IMPLANT
SCREW 6.4X75 (Screw) ×2 IMPLANT
SCREW 6.4X80 (Screw) ×2 IMPLANT
SCREW TRIGEN LOW PROF 5.0X32.5 (Screw) ×2 IMPLANT
SCREW TRIGEN LOW PROF 5.0X35 (Screw) ×2 IMPLANT
STAPLER VISISTAT 35W (STAPLE) ×3 IMPLANT
SUT VIC AB 0 CT1 27 (SUTURE) ×6
SUT VIC AB 0 CT1 27XBRD ANBCTR (SUTURE) ×2 IMPLANT
SUT VIC AB 2-0 CT1 27 (SUTURE) ×6
SUT VIC AB 2-0 CT1 TAPERPNT 27 (SUTURE) ×2 IMPLANT
TOWEL OR 17X24 6PK STRL BLUE (TOWEL DISPOSABLE) ×3 IMPLANT
TOWEL OR 17X26 10 PK STRL BLUE (TOWEL DISPOSABLE) ×3 IMPLANT
WATER STERILE IRR 1000ML POUR (IV SOLUTION) ×3 IMPLANT

## 2014-06-29 NOTE — Transfer of Care (Signed)
Immediate Anesthesia Transfer of Care Note  Patient: Austin [name]  Procedure(s) Performed: Procedure(s): INTRAMEDULLARY (IM) NAIL INTERTROCHANTRIC (Right)  Patient Location: PACU  Anesthesia Type:General  Level of Consciousness: awake and alert   Airway & Oxygen Therapy: Patient Spontanous Breathing and Patient connected to nasal cannula oxygen  Post-op Assessment: Report given to PACU RN and Post -op Vital signs reviewed and stable  Post vital signs: Reviewed and stable  Complications: No apparent anesthesia complications

## 2014-06-29 NOTE — Progress Notes (Signed)
Subjective: Patient nonverbal.  Appears comfortable.  Objective: Vital signs in last 24 hours: Filed Vitals:   06/28/14 2030 06/28/14 2045 06/28/14 2126 06/29/14 0530  BP: 132/72 132/75 132/82 118/78  Pulse: 78 87 71 76  Temp:   97.3 F (36.3 C) 97.8 F (36.6 C)  TempSrc:   Axillary Axillary  Resp: SpO2: 97% 98% 100% 100%   Weight change:   Intake/Output Summary (Last 24 hours) at 06/29/14 1251 Last data filed at 06/29/14 0800  Gross per 24 hour  Intake      0 ml  Output   1075 ml  Net  -1075 ml    Physical Exam: General: Awake, No acute distress. nonverbal.  HEENT: EOMI. Neck: Supple CV: S1 and S2 Lungs: Clear to ascultation bilaterally Abdomen: Soft, Nontender, Nondistended, +bowel sounds. Ext: Good pulses. Trace edema.  Lab Results: Basic Metabolic Panel:  Recent Labs Lab 06/28/14 1852 06/29/14 0630  NA 140 143  K 4.2 4.4  CL 106 114*  CO2 24 24  GLUCOSE 138* 107*  BUN 19 15  CREATININE 1.18 0.96  CALCIUM 9.2 8.0*   Liver Function Tests: No results for input(s): AST, ALT, ALKPHOS, BILITOT, PROT, ALBUMIN in the last 168 hours. No results for input(s): LIPASE, AMYLASE in the last 168 hours. No results for input(s): AMMONIA in the last 168 hours. CBC:  Recent Labs Lab 06/28/14 1852 06/29/14 0630  WBC 17.4* 13.9*  NEUTROABS 14.6*  --   HGB 11.8* 9.4*  HCT 34.7* 28.3*  MCV 77.5* 77.7*  PLT 232 168   Cardiac Enzymes: No results for input(s): CKTOTAL, CKMB, CKMBINDEX, TROPONINI in the last 168 hours. BNP (last 3 results) No results for input(s): PROBNP in the last 8760 hours. CBG: No results for input(s): GLUCAP in the last 168 hours. No results for input(s): HGBA1C in the last 72 hours. Other Labs: Invalid input(s): POCBNP No results for input(s): DDIMER in the last 168 hours. No results for input(s): CHOL, HDL, LDLCALC, TRIG, CHOLHDL, LDLDIRECT in the last 168 hours. No results for input(s): TSH, T4TOTAL, T3FREE, FREET4,  THYROIDAB in the last 168 hours.  Invalid input(s): FREET3 No results for input(s): VITAMINB12, FOLATE, FERRITIN, TIBC, IRON, RETICCTPCT in the last 168 hours.  Micro Results: No results found for this or any previous visit (from the past 240 hour(s)).  Studies/Results: Dg Pelvis 1-2 Views  06/28/2014   CLINICAL DATA:  Acute onset of right leg pain and swelling. Initial encounter.  EXAM: PELVIS - 1-2 VIEW  COMPARISON:  None.  FINDINGS: There is a comminuted right femoral intertrochanteric fracture, with a basicervical component, and medial rotation of the distal femur. Mildly displaced lesser and greater trochanteric fragments are seen. The right femoral head remains seated at the acetabulum. The left hip joint is grossly unremarkable. The sacroiliac joints are within normal limits. There is somewhat unusual marked soft tissue swelling along the proximal right thigh; would correlate clinically to exclude compartment syndrome.  The large amount of stool is noted filling the rectum, measuring 10.9 cm in transverse dimension, raising concern for fecal impaction. However, the remainder of the visualized bowel gas pattern is grossly unremarkable.  IMPRESSION: 1. Comminuted right femoral intertrochanteric fracture, with a basicervical component, and medial rotation of the distal femur. Mildly displaced lesser and greater trochanteric fragments seen. 2. Somewhat unusual marked soft tissue swelling along the proximal right thigh; would correlate clinically to exclude compartment syndrome. 3. Large amount of stool noted filling the rectum, measuring  10.9 cm in transverse dimension, raising concern for fecal impaction. However, the remainder of the visualized bowel gas pattern is grossly unremarkable. These results were called by telephone at the time of interpretation on 06/28/2014 at 6:35 pm to the ER physician, who verbally acknowledged these results.   Electronically Signed   By: Roanna Raider M.D.   On:  06/28/2014 18:35   Dg Femur Right  06/28/2014   CLINICAL DATA:  Seizure disorder and cerebral palsy. Right hip swelling.  EXAM: RIGHT FEMUR - 2 VIEW  COMPARISON:  None.  FINDINGS: There is an acute and comminuted fracture of the proximal right femur with displacement and angulation. The fracture plane is predominantly intertrochanteric. There is an oblique component that extends through the subtrochanteric proximal diaphysis. No dislocation. No underlying bony lesion is seen.  IMPRESSION: Acute and comminuted fracture of the proximal right femur which is primarily an intertrochanteric fracture. There is an oblique plane that does extend through the proximal diaphysis, as well, inferior to the trochanters.   Electronically Signed   By: Irish Lack M.D.   On: 06/28/2014 18:31    Medications: I have reviewed the patient's current medications. Scheduled Meds: . carbamazepine  200 mg Oral Daily   Continuous Infusions: . sodium chloride 125 mL/hr at 06/29/14 0653   PRN Meds:.acetaminophen **OR** acetaminophen, HYDROmorphone (DILAUDID) injection, oxyCODONE-acetaminophen  Assessment/Plan: Principal Problem:   Femur fracture, right Active Problems:   Infantile cerebral palsy   Seizure   Anemia  Femur fracture-right -Comminuted right femoral intertrochanteric fracture, with basicervical component, and medial rotation of the distal femur and mildly displaced lesser and greater trochanteric fragments.  -Management as per Orthopedic surgery.  - Pain control: percocet and morphine prn - NPO  History of seizure: Stable -Continue Tegretol  Anemia - Drop in hemoglobin likely dilutional and with acute blood loss from femur fracture.  Continue to monitor.    Cerebral palsy  - Stable.    CODE STATUS  - Full code.  Disposition - Inpatient.   LOS: 1 day  Jacorie Ernsberger A, MD 06/29/2014, 12:51 PM

## 2014-06-29 NOTE — H&P (Signed)
H&P update  The surgical history has been reviewed and remains accurate without interval change.  The patient was re-examined and patient's physiologic condition has not changed significantly in the last 30 days. The condition still exists that makes this procedure necessary. The treatment plan remains the same, without new options for care.  No new pharmacological allergies or types of therapy has been initiated that would change the plan or the appropriateness of the plan.  The patient and/or family understand the potential benefits and risks.  Mayra Reel, MD 06/29/2014 7:05 AM

## 2014-06-29 NOTE — Op Note (Signed)
   Date of Surgery: 06/29/2014  INDICATIONS: Austin [name] is a 39 y.o.-year-old severely involved CP nonambulatory male who sustained a right basicervical, intertrochanteric, subtrochanteric femur fracture;  The family did consent to the procedure after discussion of the risks and benefits.  PREOPERATIVE DIAGNOSIS:  1. Right basicervical femoral neck fracture 2. Right intertrochanteric-subtrochanteric femur fracture  POSTOPERATIVE DIAGNOSIS: Same.  PROCEDURE:  1. Intramedullary fixation of right intertrochanteric-subtrochanteric femur fracture. CPT [phone] 2. Open treatment of right femoral neck fracture with internal fixation. CPT [phone]  SURGEON: N. Glee Arvin, M.D.  ASSIST: none.  ANESTHESIA:  general  IV FLUIDS AND URINE: See anesthesia.  ESTIMATED BLOOD LOSS: 300 mL.  IMPLANTS: [employer] Recon nail 10 x 36 cm. 80/75 mm recon screws  DRAINS: none  COMPLICATIONS: None.  DESCRIPTION OF PROCEDURE: The patient was brought to the operating room and placed supine on the operating table.  The patient had been signed prior to the procedure and this was documented. The patient had the anesthesia placed by the anesthesiologist.  A time-out was performed to confirm that this was the correct patient, site, side and location. The patient had an SCD on the opposite lower extremity. The patient did receive antibiotics prior to the incision and was re-dosed during the procedure as needed at indicated intervals.  The patient had the operative extremity prepped and draped in the standard surgical fashion.    The patient was placed on the fracture table with the injured leg in the traction device and the well leg in the down scissored position. All the bony prominences were well-padded. The lower extremity was prepped and draped in standard sterile fashion. We first obtained a reduction through traction and rotation. Once this was done we then made an incision approximately 4 cm superior to the  greater trochanter. A guidepin was inserted down the greater trochanter towards the lesser trochanter. Its placement was confirmed on fluoroscopy. We then used an opening reamer to gain access to the femoral canal. We then placed a guide rod all the way down the canal of the femur to the level of the physes of scar distally. We then sequentially reamed to a 11.5 mm reamer which gave adequate chatter. The length of the nail was measured. We then inserted a 10 x 36 cm nail over the guide rod. Once the nail was placed at the appropriate depth its version was also confirmed on fluoroscopy. We then placed 2 recon screws up the femoral neck in order to fix the basicervical neck fracture. Once the screws were place we then placed 2 distal interlocking screws through the nail using a perfect circles technique. Final x-rays were taken. Wounds were thoroughly irrigated and closed in layer fashion using 0 Vicryl for the subcutaneous fat, 2-0 Vicryl for the deep skin layer, staples for the skin. Sterile dressings were applied. Patient was x-rayed and transferred to the PACU in stable condition.  POSTOPERATIVE PLAN: Patient will be weight bear as tolerated to right lower extremity. He will be placed on aspirin for DVT prophylaxis. He will likely need SNF placement.  Austin Reel, MD [facility] [phone] 4:49 PM

## 2014-06-29 NOTE — Progress Notes (Signed)
Utilization review completed. Fruma Africa, RN, BSN. 

## 2014-06-29 NOTE — Anesthesia Preprocedure Evaluation (Addendum)
Anesthesia Evaluation  Patient identified by MRN, date of birth, ID band Patient unresponsive    Reviewed: Allergy & Precautions, NPO status , Patient's Chart, lab work & pertinent test results, Unable to perform ROS - Chart review only  Airway        Dental  (+) Chipped, Poor Dentition   Pulmonary          Cardiovascular     Neuro/Psych Seizures -,     GI/Hepatic   Endo/Other    Renal/GU      Musculoskeletal   Abdominal   Peds  Hematology  (+) anemia ,   Anesthesia Other Findings CP Unable to assess airway as patient does not follow commands to open mouth  Reproductive/Obstetrics                           Anesthesia Physical Anesthesia Plan  ASA: II  Anesthesia Plan: General   Post-op Pain Management:    Induction: Intravenous  Airway Management Planned: Oral ETT  Additional Equipment:   Intra-op Plan:   Post-operative Plan: Extubation in OR  Informed Consent: I have reviewed the patients History and Physical, chart, labs and discussed the procedure including the risks, benefits and alternatives for the proposed anesthesia with the patient or authorized representative who has indicated his/her understanding and acceptance.     Plan Discussed with: CRNA, Anesthesiologist and Surgeon  Anesthesia Plan Comments:         Anesthesia Quick Evaluation

## 2014-06-30 ENCOUNTER — Encounter (HOSPITAL_COMMUNITY): Payer: Self-pay | Admitting: Orthopaedic Surgery

## 2014-06-30 DIAGNOSIS — D649 Anemia, unspecified: Secondary | ICD-10-CM

## 2014-06-30 LAB — BASIC METABOLIC PANEL
Anion gap: 5 (ref 5–15)
BUN: 11 mg/dL (ref 6–23)
CALCIUM: 7.9 mg/dL — AB (ref 8.4–10.5)
CO2: 27 mmol/L (ref 19–32)
CREATININE: 0.93 mg/dL (ref 0.50–1.35)
Chloride: 113 mEq/L — ABNORMAL HIGH (ref 96–112)
GFR calc Af Amer: >90 mL/min (ref >90)
GFR calc non Af Amer: >90 mL/min (ref >90)
GLUCOSE: 91 mg/dL (ref 70–99)
Potassium: 3.7 mmol/L (ref 3.5–5.1)
Sodium: 145 mmol/L (ref 135–145)

## 2014-06-30 LAB — CBC
HEMATOCRIT: 21.7 % — AB (ref 39.0–52.0)
Hemoglobin: 7.4 g/dL — ABNORMAL LOW (ref 13.0–17.0)
MCH: 26.7 pg (ref 26.0–34.0)
MCHC: 34.1 g/dL (ref 30.0–36.0)
MCV: 78.3 fL (ref 78.0–100.0)
PLATELETS: 136 10*3/uL — AB (ref 150–400)
RBC: 2.77 MIL/uL — ABNORMAL LOW (ref 4.22–5.81)
RDW: 13 % (ref 11.5–15.5)
WBC: 11.5 10*3/uL — AB (ref 4.0–10.5)

## 2014-06-30 LAB — HEMOGLOBIN AND HEMATOCRIT, BLOOD
HCT: 26.4 % — ABNORMAL LOW (ref 39.0–52.0)
Hemoglobin: 9 g/dL — ABNORMAL LOW (ref 13.0–17.0)

## 2014-06-30 LAB — GLUCOSE, CAPILLARY: GLUCOSE-CAPILLARY: 89 mg/dL (ref 70–99)

## 2014-06-30 LAB — PREPARE RBC (CROSSMATCH)

## 2014-06-30 MED ORDER — INFLUENZA VAC SPLIT QUAD 0.5 ML IM SUSY
0.5000 mL | PREFILLED_SYRINGE | INTRAMUSCULAR | Status: AC
  Administered 2014-07-01: 0.5 mL via INTRAMUSCULAR
  Filled 2014-06-30: qty 0.5

## 2014-06-30 MED ORDER — SODIUM CHLORIDE 0.9 % IV SOLN
Freq: Once | INTRAVENOUS | Status: AC
  Administered 2014-06-30: 09:00:00 via INTRAVENOUS

## 2014-06-30 NOTE — Anesthesia Postprocedure Evaluation (Signed)
  Anesthesia Post-op Note  Patient: Austin [name]  Procedure(s) Performed: Procedure(s): INTRAMEDULLARY (IM) NAIL INTERTROCHANTRIC (Right)  Patient Location: PACU  Anesthesia Type:General  Level of Consciousness: awake and alert   Airway and Oxygen Therapy: Patient Spontanous Breathing  Post-op Pain: none  Post-op Assessment: Post-op Vital signs reviewed  Post-op Vital Signs: Reviewed  Last Vitals:  Filed Vitals:   06/30/14 0553  BP: 116/56  Pulse: 99  Temp: 36.8 C  Resp: 14    Complications: No apparent anesthesia complications

## 2014-06-30 NOTE — Clinical Social Work Psychosocial (Signed)
Clinical Social Work Department BRIEF PSYCHOSOCIAL ASSESSMENT 06/30/2014  Patient:  Austin [name],Austin [name]     Account Number:  [id number]     Admit date:  06/28/2014  Clinical Social Worker:  Mosie EpsteinVAUGHN,Ansleigh Safer S, LCSWA  Date/Time:  06/30/2014 12:31 PM  Referred by:  Physician  Date Referred:  06/30/2014 Referred for  SNF Placement   Other Referral:   none.   Interview type:  Family Other interview type:   CSW spoke with pt's sister/caregiver, [name].    PSYCHOSOCIAL DATA Living Status:  FAMILY Admitted from facility:   Level of care:   Primary support name:  [name] [phone]) Primary support relationship to patient:  SIBLING Degree of support available:   Strong support system.    CURRENT CONCERNS Current Concerns  Post-Acute Placement   Other Concerns:   none.    SOCIAL WORK ASSESSMENT / PLAN CSW received referral for possible SNF placement at time of discharge. CSW spoke with pt's sister/caregiver regarding discharge disposition. Pt's sister informed CSW pt's sister provides care for pt at home, but would be agreeable to short-term SNF placement prior to pt returning home.    Pt's sister did not have preference for SNF placement at this time.    CSW to continue to follow and assist with discharge planning needs.   Assessment/plan status:  Psychosocial Support/Ongoing Assessment of Needs Other assessment/ plan:   none.   Information/referral to community resources:   [facility]    PATIENT'S/FAMILY'S RESPONSE TO PLAN OF CARE: Pt's sister understanding and agreeable to CSW plan of care. Pt's sister expressed no further questions or concerns at this time.       Marcelline Deistmily Tanish Sinkler, LCSWA [phone]) Licensed Clinical Social Worker Orthopedics [phone]([id number]) and Surgical [phone]([id number])

## 2014-06-30 NOTE — Evaluation (Signed)
Physical Therapy Evaluation Patient Details Name: Austin [name] MRN: [id number] DOB: 01-13-1976 Today's Date: 06/30/2014   History of Present Illness  Pt is a 39 y.o. male with PMH of cerebral palsy, seizures, anemia, who presents with right thigh pain and swelling. Xray reveal R femur fx.  He underwent IM nailing 06-29-14.  Clinical Impression  Pt unable to participate in acute PT sessions.  He is total assist for all mobility. He is unable to sit up due to increased trunk extensor tone.  Recommend hoyer lift if OOB is desired. PT signing off.    Follow Up Recommendations SNF;Supervision/Assistance - 24 hour    Equipment Recommendations  None recommended by PT    Recommendations for Other Services       Precautions / Restrictions Precautions Precautions: Fall Restrictions Weight Bearing Restrictions: Yes RLE Weight Bearing: Weight bearing as tolerated      Mobility  Bed Mobility Overal bed mobility: Needs Assistance;+2 for physical assistance Bed Mobility: Rolling;Supine to Sit;Sit to Supine Rolling: Total assist   Supine to sit: Total assist;HOB elevated Sit to supine: Total assist;HOB elevated      Transfers                    Ambulation/Gait                Stairs            Wheelchair Mobility    Modified Rankin (Stroke Patients Only)       Balance Overall balance assessment: Needs assistance Sitting-balance support: No upper extremity supported;Feet unsupported Sitting balance-Leahy Scale: Zero Sitting balance - Comments: Pt retropulsive with increased trunk extensor tone when attempting to sit EOB.  Pt unable to attain full upright sitting position. Total assist required.                                     Pertinent Vitals/Pain Pain Assessment: Faces Faces Pain Scale: Hurts little more Pain Location: RLE with movement Pain Descriptors / Indicators: Grimacing Pain Intervention(s): Monitored during session     Home Living Family/patient expects to be discharged to:: Private residence Living Arrangements: Other relatives (sister) Available Help at Discharge: Family;Personal care attendant;Available 24 hours/day Type of Home: House         Home Equipment: Wheelchair - manual      Prior Function Level of Independence: Needs assistance   Gait / Transfers Assistance Needed: +1 assist for stand pivot transfers on LLE to w/c (per pt's sister)  ADL's / Homemaking Assistance Needed: total assist for ADLs        Hand Dominance        Extremity/Trunk Assessment   Upper Extremity Assessment: RUE deficits/detail RUE Deficits / Details: severe flexion contractures, increasing tone with mobility         Lower Extremity Assessment: RLE deficits/detail;LLE deficits/detail RLE Deficits / Details: severe flexion contractures, increasing tone with mobility LLE Deficits / Details: able to obtain full extension in supine, increased tone with sitting     Communication   Communication: Expressive difficulties;Receptive difficulties;Other (comment) (nonverbal)  Cognition Arousal/Alertness: Awake/alert Behavior During Therapy: Flat affect Overall Cognitive Status: History of cognitive impairments - at baseline                      General Comments      Exercises        Assessment/Plan  PT Assessment All further PT needs can be met in the next venue of care  PT Diagnosis Generalized weakness;Acute pain   PT Problem List Decreased activity tolerance;Decreased balance;Decreased mobility;Pain;Decreased cognition;Decreased strength;Decreased range of motion;Impaired tone  PT Treatment Interventions     PT Goals (Current goals can be found in the Care Plan section)      Frequency     Barriers to discharge        Co-evaluation               End of Session   Activity Tolerance: Patient tolerated treatment well Patient left: in bed;with call bell/phone within  reach;with bed alarm set Nurse Communication: Mobility status;Need for lift equipment         Time: 1100-1121 PT Time Calculation (min) (ACUTE ONLY): 21 min   Charges:   PT Evaluation $Initial PT Evaluation Tier I: 1 Procedure PT Treatments $Therapeutic Activity: 8-22 mins   PT G Codes:        Lorriane Shire 06/30/2014, 11:43 AM

## 2014-06-30 NOTE — Progress Notes (Signed)
   Subjective:  Patient is nonverbal.  Objective:   VITALS:   Filed Vitals:   06/29/14 1754 06/29/14 2005 06/30/14 0128 06/30/14 0553  BP: 133/78 135/67 133/72 116/56  Pulse: 76 81 78 99  Temp: 98.2 F (36.8 C) 98.3 F (36.8 C) 98.2 F (36.8 C) 98.3 F (36.8 C)  TempSrc:      Resp: 14 14 14 14   SpO2: 100% 100% 100% 100%    Nonverbal in no apparent distress Dressings c/d/i Leg wwp   Lab Results  Component Value Date   WBC 11.5* 06/30/2014   HGB 7.4* 06/30/2014   HCT 21.7* 06/30/2014   MCV 78.3 06/30/2014   PLT 136* 06/30/2014     Assessment/Plan:  1 Day Post-Op   - Expected postop acute blood loss anemia - 1 unit of RBCs ordered - Up with PT/OT - DVT ppx - SCDs, ambulation, asa - WBAT right lower extremity - likely needs SNF  Cheral AlmasXu, Austin [name] 06/30/2014, 7:55 AM [phone]

## 2014-06-30 NOTE — Progress Notes (Signed)
Orthopedic Tech Progress Note Patient Details:  Austin GrumblingReuben [name] Apr 29, 1976 [id number]  Patient ID: Austin Grumblingeuben Rosamond, male   DOB: Apr 29, 1976, 39 y.o.   MRN: [id number] Pt unable to use trapeze bar patient helper; RN notified  Nikki DomCrawford, Sulayman Manning 06/30/2014, 9:51 AM

## 2014-06-30 NOTE — Progress Notes (Signed)
Occupational Therapy Discharge Patient Details Name: Talbot GrumblingReuben Ayler MRN: [id number] DOB: [birth year] Today's Date: 06/30/2014 Time:  -     Patient discharged from OT services secondary to pt at baseline total (A) for bathing, feeding and dressing ( ADLS). Ot spoke directly to sister via phone ( [phone]) . Sister reports that pt stand pivot to w/c 1 person (A) on L LE only when transferred. pt contracted on R side at baseline. Sister reports pt can crawl on floor when placed on the floor. Pt has aide M-F and 2 sisters provide care in the night hours and weekends. .  Please see latest therapy progress note for current level of functioning and progress toward goals.    Progress and discharge plan discussed with patient and/or caregiver: Patient/Caregiver agrees with plan  GO     Harolyn RutherfordJones, Kaedynce Tapp B  Pager: [phone]  06/30/2014, 11:12 AM

## 2014-06-30 NOTE — Progress Notes (Signed)
Subjective: Patient nonverbal. No complaints.  Objective: Vital signs in last 24 hours: Filed Vitals:   06/30/14 0553 06/30/14 0845 06/30/14 0900 06/30/14 1111  BP: 116/56 109/64 106/52 122/71  Pulse: 99 103 104 86  Temp: 98.3 F (36.8 C) 97.8 F (36.6 C) 97.6 F (36.4 C) 98.3 F (36.8 C)  TempSrc:  Axillary Axillary Axillary  Resp: Weight:   47.8 kg (105 lb 6.1 oz)   SpO2: 100% 100% 100% 100%   Weight change:   Intake/Output Summary (Last 24 hours) at 06/30/14 1324 Last data filed at 06/30/14 1200  Gross per 24 hour  Intake 6593.33 ml  Output   1150 ml  Net 5443.33 ml    Physical Exam: General: Awake, No acute distress. nonverbal.  HEENT: EOMI. Neck: Supple CV: S1 and S2 Lungs: Clear to ascultation bilaterally Abdomen: Soft, Nontender, Nondistended, +bowel sounds. Ext: Good pulses. Trace edema.  Lab Results: Basic Metabolic Panel:  Recent Labs Lab 06/28/14 1852 06/29/14 0630 06/30/14 0610  NA 140 143 145  K 4.2 4.4 3.7  CL 106 114* 113*  CO2 GLUCOSE 138* 107* 91  BUN CREATININE 1.18 0.96 0.93  CALCIUM 9.2 8.0* 7.9*   Liver Function Tests: No results for input(s): AST, ALT, ALKPHOS, BILITOT, PROT, ALBUMIN in the last 168 hours. No results for input(s): LIPASE, AMYLASE in the last 168 hours. No results for input(s): AMMONIA in the last 168 hours. CBC:  Recent Labs Lab 06/28/14 1852 06/29/14 0630 06/30/14 0610 06/30/14 1236  WBC 17.4* 13.9* 11.5*  --   NEUTROABS 14.6*  --   --   --   HGB 11.8* 9.4* 7.4* 9.0*  HCT 34.7* 28.3* 21.7* 26.4*  MCV 77.5* 77.7* 78.3  --   PLT 232 168 136*  --    Cardiac Enzymes: No results for input(s): CKTOTAL, CKMB, CKMBINDEX, TROPONINI in the last 168 hours. BNP (last 3 results) No results for input(s): PROBNP in the last 8760 hours. CBG:  Recent Labs Lab 06/29/14 2126 06/30/14 0638  GLUCAP 80 89   No results for input(s): HGBA1C in the last 72 hours. Other Labs: Invalid  input(s): POCBNP No results for input(s): DDIMER in the last 168 hours. No results for input(s): CHOL, HDL, LDLCALC, TRIG, CHOLHDL, LDLDIRECT in the last 168 hours. No results for input(s): TSH, T4TOTAL, T3FREE, FREET4, THYROIDAB in the last 168 hours.  Invalid input(s): FREET3 No results for input(s): VITAMINB12, FOLATE, FERRITIN, TIBC, IRON, RETICCTPCT in the last 168 hours.  Micro Results: Recent Results (from the past 240 hour(s))  Urine culture     Status: None   Collection Time: 06/28/14  7:59 PM  Result Value Ref Range Status   Specimen Description URINE, RANDOM  Final   Special Requests NONE  Final   Colony Count NO GROWTH Performed at [facility]   Final   Culture NO GROWTH Performed at [facility]   Final   Report Status 06/29/2014 FINAL  Final    Studies/Results: Dg Pelvis 1-2 Views  06/28/2014   CLINICAL DATA:  Acute onset of right leg pain and swelling. Initial encounter.  EXAM: PELVIS - 1-2 VIEW  COMPARISON:  None.  FINDINGS: There is a comminuted right femoral intertrochanteric fracture, with a basicervical component, and medial rotation of the distal femur. Mildly displaced lesser and greater trochanteric fragments are seen. The right femoral head remains seated at the acetabulum. The left hip joint is grossly  unremarkable. The sacroiliac joints are within normal limits. There is somewhat unusual marked soft tissue swelling along the proximal right thigh; would correlate clinically to exclude compartment syndrome.  The large amount of stool is noted filling the rectum, measuring 10.9 cm in transverse dimension, raising concern for fecal impaction. However, the remainder of the visualized bowel gas pattern is grossly unremarkable.  IMPRESSION: 1. Comminuted right femoral intertrochanteric fracture, with a basicervical component, and medial rotation of the distal femur. Mildly displaced lesser and greater trochanteric fragments seen. 2. Somewhat unusual marked  soft tissue swelling along the proximal right thigh; would correlate clinically to exclude compartment syndrome. 3. Large amount of stool noted filling the rectum, measuring 10.9 cm in transverse dimension, raising concern for fecal impaction. However, the remainder of the visualized bowel gas pattern is grossly unremarkable. These results were called by telephone at the time of interpretation on 06/28/2014 at 6:35 pm to the ER physician, who verbally acknowledged these results.   Electronically Signed   By: Roanna RaiderJeffery  Chang M.D.   On: 06/28/2014 18:35   Dg Femur Right  06/28/2014   CLINICAL DATA:  Seizure disorder and cerebral palsy. Right hip swelling.  EXAM: RIGHT FEMUR - 2 VIEW  COMPARISON:  None.  FINDINGS: There is an acute and comminuted fracture of the proximal right femur with displacement and angulation. The fracture plane is predominantly intertrochanteric. There is an oblique component that extends through the subtrochanteric proximal diaphysis. No dislocation. No underlying bony lesion is seen.  IMPRESSION: Acute and comminuted fracture of the proximal right femur which is primarily an intertrochanteric fracture. There is an oblique plane that does extend through the proximal diaphysis, as well, inferior to the trochanters.   Electronically Signed   By: Irish LackGlenn  Yamagata M.D.   On: 06/28/2014 18:31   Dg C-arm 1-60 Min  06/29/2014   CLINICAL DATA:  ORIF RIGHT femoral fracture  EXAM: DG C-ARM 61-120 MIN; RADIOLOGY EXAMINATION  FLUOROSCOPY TIME:  2 minutes 13 seconds  COMPARISON:  06/28/2013  FINDINGS: Diffuse osseous demineralization.  IM nail with three screws placed across an intertrochanteric fracture of the proximal RIGHT femur.  No dislocation.  Two distal locking screws are noted.  Visualized pelvis intact.  IMPRESSION: Post ORIF of intertrochanteric fracture RIGHT femur.   Electronically Signed   By: Ulyses SouthwardMark  Boles M.D.   On: 06/29/2014 16:53   Dg Femur, Min 2 Views Right  06/29/2014   CLINICAL DATA:   ORIF RIGHT femoral fracture  EXAM: DG C-ARM 61-120 MIN; RADIOLOGY EXAMINATION  FLUOROSCOPY TIME:  2 minutes 13 seconds  COMPARISON:  06/28/2013  FINDINGS: Diffuse osseous demineralization.  IM nail with three screws placed across an intertrochanteric fracture of the proximal RIGHT femur.  No dislocation.  Two distal locking screws are noted.  Visualized pelvis intact.  IMPRESSION: Post ORIF of intertrochanteric fracture RIGHT femur.   Electronically Signed   By: Ulyses SouthwardMark  Boles M.D.   On: 06/29/2014 16:53    Medications: I have reviewed the patient's current medications. Scheduled Meds: . aspirin EC  325 mg Oral BID  . carbamazepine  200 mg Oral Daily  .  ceFAZolin (ANCEF) IV  2 g Intravenous Q6H  . senna  1 tablet Oral BID   Continuous Infusions: . sodium chloride 125 mL/hr at 06/30/14 1111  . sodium chloride 125 mL/hr at 06/30/14 0453  . lactated ringers 50 mL/hr at 06/29/14 1333   PRN Meds:.acetaminophen **OR** acetaminophen, alum & mag hydroxide-simeth, HYDROcodone-acetaminophen, HYDROmorphone (DILAUDID) injection, HYDROmorphone (DILAUDID) injection,  ketorolac, menthol-cetylpyridinium **OR** phenol, methocarbamol **OR** methocarbamol (ROBAXIN)  IV, metoCLOPramide **OR** metoCLOPramide (REGLAN) injection, morphine injection, ondansetron **OR** ondansetron (ZOFRAN) IV, oxyCODONE, oxyCODONE-acetaminophen, polyethylene glycol, sorbitol  Assessment/Plan: Principal Problem:   Femur fracture, right Active Problems:   Infantile cerebral palsy   Seizure   Anemia  Femur fracture-right -Comminuted right femoral intertrochanteric fracture, with basicervical component, and medial rotation of the distal femur and mildly displaced lesser and greater trochanteric fragments.  -Management as per Orthopedic surgery.  -Pain control: percocet and morphine prn. -On ASA. WBAT as tolerated.  Acute blood loss anemia -Due to femur fracture and surgery -Patient transfused 1 unit of PRBC.  History of seizure:  Stable -Continue Tegretol  Cerebral palsy  - Stable.    CODE STATUS  - Full code.  Disposition - Inpatient, to SNF at the time of discharge.   LOS: 2 days  Grover Woodfield A, MD 06/30/2014, 1:24 PM

## 2014-06-30 NOTE — Clinical Social Work Placement (Addendum)
Clinical Social Work Department CLINICAL SOCIAL WORK PLACEMENT NOTE 06/30/2014  Patient:  Austin [name],Austin [name]  Account Number:  [id number] Admit date:  06/28/2014  Clinical Social Worker:  Mosie EpsteinEMILY S Tanyon Alipio, LCSWA  Date/time:  06/30/2014 12:35 PM  Clinical Social Work is seeking post-discharge placement for this patient at the following level of care:   SKILLED NURSING   (*CSW will update this form in Epic as items are completed)   06/30/2014  Patient/family provided with [facility] Department of Clinical Social Work's list of facilities offering this level of care within the geographic area requested by the patient (or if unable, by the patient's family).  06/30/2014  Patient/family informed of their freedom to choose among providers that offer the needed level of care, that participate in Medicare, Medicaid or managed care program needed by the patient, have an available bed and are willing to accept the patient.  06/30/2014  Patient/family informed of [facility]' ownership interest in [facility], as well as of the fact that they are under no obligation to receive care at this facility.  PASARR submitted to EDS on 06/30/2014 PASARR number received on 06/30/2014  FL2 transmitted to all facilities in geographic area requested by pt/family on  06/30/2014 FL2 transmitted to all facilities within larger geographic area on   Patient informed that his/her managed care company has contracts with or will negotiate with  certain facilities, including the following:     Patient/family informed of bed offers received:  07/01/2014 [facility] Physician recommends and patient chooses bed at    [facility]  07/[facility]/2014 Patient to be transferred to facility by [facility] Patient and family notified of transfer on 07/01/2014 Name of family member notified:  Pt's sister, Okey RegalCarol, updated regarding discharge.  The  following physician request were entered in Epic:   Additional Comments:  Lily Kochermily Arbadella Kimbler, LCSWA [phone]) Licensed Clinical Social Worker Orthopedics [phone]([id number]) and Surgical [phone]([id number])

## 2014-07-01 DIAGNOSIS — G808 Other cerebral palsy: Secondary | ICD-10-CM | POA: Diagnosis not present

## 2014-07-01 DIAGNOSIS — Z4789 Encounter for other orthopedic aftercare: Secondary | ICD-10-CM | POA: Diagnosis not present

## 2014-07-01 DIAGNOSIS — G40909 Epilepsy, unspecified, not intractable, without status epilepticus: Secondary | ICD-10-CM | POA: Diagnosis not present

## 2014-07-01 DIAGNOSIS — D649 Anemia, unspecified: Secondary | ICD-10-CM | POA: Diagnosis not present

## 2014-07-01 DIAGNOSIS — R079 Chest pain, unspecified: Secondary | ICD-10-CM | POA: Diagnosis not present

## 2014-07-01 DIAGNOSIS — S72141S Displaced intertrochanteric fracture of right femur, sequela: Secondary | ICD-10-CM | POA: Diagnosis not present

## 2014-07-01 DIAGNOSIS — S72141A Displaced intertrochanteric fracture of right femur, initial encounter for closed fracture: Secondary | ICD-10-CM | POA: Diagnosis not present

## 2014-07-01 DIAGNOSIS — G809 Cerebral palsy, unspecified: Secondary | ICD-10-CM | POA: Diagnosis not present

## 2014-07-01 DIAGNOSIS — R569 Unspecified convulsions: Secondary | ICD-10-CM | POA: Diagnosis not present

## 2014-07-01 DIAGNOSIS — S72041A Displaced fracture of base of neck of right femur, initial encounter for closed fracture: Secondary | ICD-10-CM | POA: Diagnosis not present

## 2014-07-01 DIAGNOSIS — K5900 Constipation, unspecified: Secondary | ICD-10-CM | POA: Diagnosis not present

## 2014-07-01 DIAGNOSIS — M84451S Pathological fracture, right femur, sequela: Secondary | ICD-10-CM | POA: Diagnosis not present

## 2014-07-01 DIAGNOSIS — M79651 Pain in right thigh: Secondary | ICD-10-CM | POA: Diagnosis present

## 2014-07-01 DIAGNOSIS — S72141D Displaced intertrochanteric fracture of right femur, subsequent encounter for closed fracture with routine healing: Secondary | ICD-10-CM | POA: Diagnosis not present

## 2014-07-01 DIAGNOSIS — Z993 Dependence on wheelchair: Secondary | ICD-10-CM | POA: Diagnosis not present

## 2014-07-01 DIAGNOSIS — S7291XA Unspecified fracture of right femur, initial encounter for closed fracture: Secondary | ICD-10-CM | POA: Diagnosis not present

## 2014-07-01 DIAGNOSIS — R945 Abnormal results of liver function studies: Secondary | ICD-10-CM | POA: Diagnosis not present

## 2014-07-01 DIAGNOSIS — D62 Acute posthemorrhagic anemia: Secondary | ICD-10-CM | POA: Diagnosis not present

## 2014-07-01 DIAGNOSIS — S72041D Displaced fracture of base of neck of right femur, subsequent encounter for closed fracture with routine healing: Secondary | ICD-10-CM | POA: Diagnosis not present

## 2014-07-01 DIAGNOSIS — Z23 Encounter for immunization: Secondary | ICD-10-CM | POA: Diagnosis not present

## 2014-07-01 DIAGNOSIS — X58XXXA Exposure to other specified factors, initial encounter: Secondary | ICD-10-CM | POA: Diagnosis not present

## 2014-07-01 DIAGNOSIS — M6281 Muscle weakness (generalized): Secondary | ICD-10-CM | POA: Diagnosis not present

## 2014-07-01 DIAGNOSIS — G4089 Other seizures: Secondary | ICD-10-CM | POA: Diagnosis not present

## 2014-07-01 LAB — CBC
HCT: 25.5 % — ABNORMAL LOW (ref 39.0–52.0)
Hemoglobin: 8.7 g/dL — ABNORMAL LOW (ref 13.0–17.0)
MCH: 26.4 pg (ref 26.0–34.0)
MCHC: 34.1 g/dL (ref 30.0–36.0)
MCV: 77.3 fL — ABNORMAL LOW (ref 78.0–100.0)
Platelets: 146 10*3/uL — ABNORMAL LOW (ref 150–400)
RBC: 3.3 MIL/uL — AB (ref 4.22–5.81)
RDW: 13.2 % (ref 11.5–15.5)
WBC: 12.6 10*3/uL — ABNORMAL HIGH (ref 4.0–10.5)

## 2014-07-01 LAB — TYPE AND SCREEN
ABO/RH(D): B POS
ANTIBODY SCREEN: NEGATIVE
UNIT DIVISION: 0

## 2014-07-01 LAB — BASIC METABOLIC PANEL
ANION GAP: 5 (ref 5–15)
BUN: 8 mg/dL (ref 6–23)
CHLORIDE: 117 meq/L — AB (ref 96–112)
CO2: 23 mmol/L (ref 19–32)
Calcium: 7.9 mg/dL — ABNORMAL LOW (ref 8.4–10.5)
Creatinine, Ser: 0.85 mg/dL (ref 0.50–1.35)
GFR calc Af Amer: >90 mL/min (ref >90)
GFR calc non Af Amer: >90 mL/min (ref >90)
GLUCOSE: 114 mg/dL — AB (ref 70–99)
Potassium: 3.7 mmol/L (ref 3.5–5.1)
Sodium: 145 mmol/L (ref 135–145)

## 2014-07-01 MED ORDER — ACETAMINOPHEN 325 MG PO TABS: 650.0000 mg | ORAL_TABLET | Freq: Four times a day (QID) | ORAL | Status: DC | PRN

## 2014-07-01 MED ORDER — SENNA 8.6 MG PO TABS: 1.0000 | ORAL_TABLET | Freq: Two times a day (BID) | ORAL | Status: DC

## 2014-07-01 MED ORDER — ONDANSETRON HCL 4 MG PO TABS: 4.0000 mg | ORAL_TABLET | Freq: Four times a day (QID) | ORAL | Status: DC | PRN

## 2014-07-01 MED ORDER — POLYETHYLENE GLYCOL 3350 17 G PO PACK: 17.0000 g | PACK | Freq: Every day | ORAL | Status: DC | PRN

## 2014-07-01 NOTE — Clinical Social Work Note (Signed)
[facility]. Pt's sister, Okey RegalCarol, updated at bedside.  [facility] Report number: [phone] [address] [facility] [address]  Marcelline Deistmily Oluwatimilehin Balfour, LCSWA [phone]) Licensed Clinical Social Worker Orthopedics [phone]([id number]) and Surgical [phone]([id number])

## 2014-07-01 NOTE — Progress Notes (Signed)
Dressings changed Appropriate response to tranfusion Continue with PT - agree with SNF May dc from ortho standpoint  N. Glee ArvinMichael Makaley Storts, MD [facility] [phone] 8:05 AM

## 2014-07-01 NOTE — Progress Notes (Signed)
CARE MANAGEMENT NOTE 07/01/2014  Patient:  Austin [name],Austin [name]   Account Number:  [id number]  Date Initiated:  07/01/2014  Documentation initiated by:  [facility]Kylinn Shropshire  Subjective/Objective Assessment:   Femur fracture, right     Action/Plan:   SNF   Anticipated DC Date:  07/01/2014   Anticipated DC Plan:  SKILLED NURSING FACILITY  In-house referral  Clinical Social Worker      DC Planning Services  CM consult      Choice offered to / List presented to:             Status of service:  Completed, signed off Medicare Important Message given?  YES (If response is "NO", the following Medicare IM given date fields will be blank) Date Medicare IM given:  07/01/2014 Medicare IM given by:  [facility]Arwa Yero Date Additional Medicare IM given:   Additional Medicare IM given by:    Discharge Disposition:  SKILLED NURSING FACILITY  Per UR Regulation:    If discussed at Long Length of Stay Meetings, dates discussed:    Comments:  07/01/2014 1100 Chart reviewed. CSW following for SNF placement. Isidoro DonningAlesia Sherie Dobrowolski RN CCM Case Mgmt phone [phone]

## 2014-07-01 NOTE — Progress Notes (Signed)
Subjective: Patient nonverbal. No complaints.  Per nursing, has had good appetite.  Objective: Vital signs in last 24 hours: Filed Vitals:   06/30/14 2115 07/01/14 0000 07/01/14 0400 07/01/14 0455  BP: 132/74   131/77  Pulse: 105   110  Temp: 98.8 F (37.1 C)   98.7 F (37.1 C)  TempSrc: Axillary   Oral  Resp: 16 16 16 16   Weight:      SpO2: 100%   100%   Weight change:   Intake/Output Summary (Last 24 hours) at 07/01/14 1058 Last data filed at 07/01/14 [id number]  Gross per 24 hour  Intake 1539.58 ml  Output   2300 ml  Net -760.42 ml    Physical Exam: General: Awake, No acute distress. nonverbal.  HEENT: EOMI. Neck: Supple CV: S1 and S2 Lungs: Clear to ascultation bilaterally Abdomen: Soft, Nontender, Nondistended, +bowel sounds. Ext: Good pulses. Trace edema.  Lab Results: Basic Metabolic Panel:  Recent Labs Lab 06/28/14 1852 06/29/14 0630 06/30/14 0610 07/01/14 0602  NA 140 143 145 145  K 4.2 4.4 3.7 3.7  CL 106 114* 113* 117*  CO2 24 24 27 23   GLUCOSE 138* 107* 91 114*  BUN 19 15 11 8   CREATININE 1.18 0.96 0.93 0.85  CALCIUM 9.2 8.0* 7.9* 7.9*   Liver Function Tests: No results for input(s): AST, ALT, ALKPHOS, BILITOT, PROT, ALBUMIN in the last 168 hours. No results for input(s): LIPASE, AMYLASE in the last 168 hours. No results for input(s): AMMONIA in the last 168 hours. CBC:  Recent Labs Lab 06/28/14 1852 06/29/14 0630 06/30/14 0610 06/30/14 1236 07/01/14 0602  WBC 17.4* 13.9* 11.5*  --  12.6*  NEUTROABS 14.6*  --   --   --   --   HGB 11.8* 9.4* 7.4* 9.0* 8.7*  HCT 34.7* 28.3* 21.7* 26.4* 25.5*  MCV 77.5* 77.7* 78.3  --  77.3*  PLT 232 168 136*  --  146*   Cardiac Enzymes: No results for input(s): CKTOTAL, CKMB, CKMBINDEX, TROPONINI in the last 168 hours. BNP (last 3 results) No results for input(s): PROBNP in the last 8760 hours. CBG:  Recent Labs Lab 06/29/14 2126 06/30/14 0638  GLUCAP 80 89   No results for input(s): HGBA1C in  the last 72 hours. Other Labs: Invalid input(s): POCBNP No results for input(s): DDIMER in the last 168 hours. No results for input(s): CHOL, HDL, LDLCALC, TRIG, CHOLHDL, LDLDIRECT in the last 168 hours. No results for input(s): TSH, T4TOTAL, T3FREE, FREET4, THYROIDAB in the last 168 hours.  Invalid input(s): FREET3 No results for input(s): VITAMINB12, FOLATE, FERRITIN, TIBC, IRON, RETICCTPCT in the last 168 hours.  Micro Results: Recent Results (from the past 240 hour(s))  Urine culture     Status: None   Collection Time: 06/28/14  7:59 PM  Result Value Ref Range Status   Specimen Description URINE, RANDOM  Final   Special Requests NONE  Final   Colony Count NO GROWTH [facility]formed at [facility]   Final   Culture NO GROWTH [facility]formed at [facility]   Final   Report Status 06/29/2014 FINAL  Final    Studies/Results: Dg C-arm 1-60 Min  06/29/2014   CLINICAL DATA:  ORIF RIGHT femoral fracture  EXAM: DG C-ARM 61-120 MIN; RADIOLOGY EXAMINATION  FLUOROSCOPY TIME:  2 minutes 13 seconds  COMPARISON:  06/28/2013  FINDINGS: Diffuse osseous demineralization.  IM nail with three screws placed across an intertrochanteric fracture of the proximal RIGHT femur.  No dislocation.  Two distal locking screws are noted.  Visualized pelvis intact.  IMPRESSION: Post ORIF of intertrochanteric fracture RIGHT femur.   Electronically Signed   By: Ulyses Southward M.D.   On: 06/29/2014 16:53   Dg Femur, Min 2 Views Right  06/29/2014   CLINICAL DATA:  ORIF RIGHT femoral fracture  EXAM: DG C-ARM 61-120 MIN; RADIOLOGY EXAMINATION  FLUOROSCOPY TIME:  2 minutes 13 seconds  COMPARISON:  06/28/2013  FINDINGS: Diffuse osseous demineralization.  IM nail with three screws placed across an intertrochanteric fracture of the proximal RIGHT femur.  No dislocation.  Two distal locking screws are noted.  Visualized pelvis intact.  IMPRESSION: Post ORIF of intertrochanteric fracture RIGHT femur.   Electronically Signed    By: Ulyses Southward M.D.   On: 06/29/2014 16:53    Medications: I have reviewed the patient's current medications. Scheduled Meds: . aspirin EC  325 mg Oral BID  . carbamazepine  200 mg Oral Daily  . senna  1 tablet Oral BID   Continuous Infusions: . sodium chloride 125 mL/hr at 06/30/14 1111  . sodium chloride 125 mL/hr at 06/30/14 0453  . lactated ringers 50 mL/hr at 06/29/14 1333   PRN Meds:.acetaminophen **OR** acetaminophen, alum & mag hydroxide-simeth, HYDROcodone-acetaminophen, HYDROmorphone (DILAUDID) injection, HYDROmorphone (DILAUDID) injection, ketorolac, menthol-cetylpyridinium **OR** phenol, methocarbamol **OR** methocarbamol (ROBAXIN)  IV, metoCLOPramide **OR** metoCLOPramide (REGLAN) injection, morphine injection, ondansetron **OR** ondansetron (ZOFRAN) IV, oxyCODONE, oxyCODONE-acetaminophen, polyethylene glycol, sorbitol  Assessment/Plan: Principal Problem:   Femur fracture, right Active Problems:   Infantile cerebral palsy   Seizure   Anemia  Femur fracture-right -Comminuted right femoral intertrochanteric fracture, with basicervical component, and medial rotation of the distal femur and mildly displaced lesser and greater trochanteric fragments.  -Management as per Orthopedic surgery.  -Pain control: percocet and morphine prn. -On ASA 325 mg twice a day for prophylaxis. WBAT as tolerated.  Acute blood loss anemia -Due to femur fracture and surgery -Patient transfused 1 unit of PRBC on 06/30/2014, hemoglobin improved.  History of seizure: Stable -Continue Tegretol  Cerebral palsy  - Stable.    CODE STATUS  - Full code.  Disposition - Inpatient, to SNF when bed is available.   LOS: 3 days  Vollie Brunty A, MD 07/01/2014, 10:58 AM

## 2014-07-01 NOTE — Discharge Summary (Signed)
Discharge Summary  Austin [name] MR#: [id number]  DOB:13-Feb-1976  Date of Admission: 06/28/2014 Date of Discharge: 07/01/2014  Patient's PCP: Lottie Mussel, MD  Attending Physician:Chrisha Vogel A  Consults: Dr. Frankey Shown (Ortho)  Discharge Diagnoses: Principal Problem:   Femur fracture, right Active Problems:   Infantile cerebral palsy   Seizure   Anemia  Brief Admitting History and Physical On admission: "Austin [name] is a 39 y.o. male with PMH of cerebral palsy, seizures, anemia, who presents with right thigh pain and swelling  Patient is wheelchire bound and does not talk due to cerebral palsy. Per sister, his caregiver, patient was noticed to have right thigh pain and swelling is Saturday morning. Caregiver assumed that patient could have had fall and injured himself, but she did not witnessed the event. Patient has intermittent seizures, but no witnessed seizures recently. Caregiver has not witnessed any other injuries. No fever, chills, cough, SOB, abdominal pain, diarrhea, dysuria, urgency, frequency, hematuria, skin rashes.   Work up in the ED demonstrates: X-ray of right leg: Comminuted right femoral intertrochanteric fracture, with basicervical component, and medial rotation of the distal femur and mildly displaced lesser and greater trochanteric fragments. Hemoglobin decreased from previous of 14.5 on 10/27/13 to 11.8. Leukocytosis with WBC 17.4. Patient is admitted to inpatient for further evaluation and treatment. Orthopedic surgeon was consulted by ED."  Discharge Medications   Medication List    TAKE these medications        acetaminophen 325 MG tablet  Commonly known as:  TYLENOL  Take 2 tablets (650 mg total) by mouth every 6 (six) hours as needed for mild pain (or Fever >/= 101).     aspirin EC 325 MG tablet  Take 1 tablet (325 mg total) by mouth 2 (two) times daily.     carbamazepine 200 MG tablet  Commonly known as:  TEGRETOL  Take 1 tablet (200 mg  total) by mouth daily.     HYDROcodone-acetaminophen 5-325 MG per tablet  Commonly known as:  NORCO  Take 1-2 tablets by mouth every 6 (six) hours as needed.     ibuprofen 200 MG tablet  Commonly known as:  ADVIL,MOTRIN  Take 200 mg by mouth every 6 (six) hours as needed for moderate pain.     ketoconazole 2 % cream  Commonly known as:  NIZORAL  Apply topically 2 (two) times daily. Apply to flaky skin on face and scalp for about 4 weeks     ondansetron 4 MG tablet  Commonly known as:  ZOFRAN  Take 1 tablet (4 mg total) by mouth every 6 (six) hours as needed for nausea.     polyethylene glycol packet  Commonly known as:  MIRALAX / GLYCOLAX  Take 17 g by mouth daily as needed for mild constipation.     senna 8.6 MG Tabs tablet  Commonly known as:  SENOKOT  Take 1 tablet (8.6 mg total) by mouth 2 (two) times daily.     VITAMIN A & D EX  Apply 1 application topically daily. to scalp.        Hospital Course: Femur fracture, right Present on Admission:  . Femur fracture, right . Anemia . Infantile cerebral palsy  Femur fracture-right -Comminuted right femoral intertrochanteric fracture, with basicervical component, and medial rotation of the distal femur and mildly displaced lesser and greater trochanteric fragments.  -S/p Intramedullary fixation of right intertrochanteric-subtrochanteric femur fracture and Open treatment of right femoral neck fracture with internal fixation on 06/29/2014. -On ASA. WBAT as tolerated.  Acute blood loss anemia -Due to femur fracture and surgery -Patient transfused 1 unit of PRBC.  History of seizure: Stable -Continue Tegretol  Cerebral palsy  - Stable.   Day of Discharge BP 131/77 mmHg  Pulse 110  Temp(Src) 98.7 F (37.1 C) (Oral)  Resp 16  Wt 47.8 kg (105 lb 6.1 oz)  SpO2 100%  Results for orders placed or performed during the hospital encounter of 06/28/14 (from the past 48 hour(s))  Glucose, capillary     Status: None    Collection Time: 06/29/14  9:26 PM  Result Value Ref Range   Glucose-Capillary 80 70 - 99 mg/dL   Comment 1 Notify RN   CBC     Status: Abnormal   Collection Time: 06/30/14  6:10 AM  Result Value Ref Range   WBC 11.5 (H) 4.0 - 10.5 K/uL   RBC 2.77 (L) 4.22 - 5.81 MIL/uL   Hemoglobin 7.4 (L) 13.0 - 17.0 g/dL    Comment: REPEATED TO VERIFY   HCT 21.7 (L) 39.0 - 52.0 %   MCV 78.3 78.0 - 100.0 fL   MCH 26.7 26.0 - 34.0 pg   MCHC 34.1 30.0 - 36.0 g/dL   RDW 13.0 11.5 - 15.5 %   Platelets 136 (L) 150 - 400 K/uL  Basic metabolic panel     Status: Abnormal   Collection Time: 06/30/14  6:10 AM  Result Value Ref Range   Sodium 145 135 - 145 mmol/L    Comment: Please note change in reference range.   Potassium 3.7 3.5 - 5.1 mmol/L    Comment: Please note change in reference range.   Chloride 113 (H) 96 - 112 mEq/L   CO2 27 19 - 32 mmol/L   Glucose, Bld 91 70 - 99 mg/dL   BUN 11 6 - 23 mg/dL   Creatinine, Ser 0.93 0.50 - 1.35 mg/dL   Calcium 7.9 (L) 8.4 - 10.5 mg/dL   GFR calc non Af Amer >90 >90 mL/min   GFR calc Af Amer >90 >90 mL/min    Comment: (NOTE) The eGFR has been calculated using the CKD EPI equation. This calculation has not been validated in all clinical situations. eGFR's persistently <90 mL/min signify possible Chronic Kidney Disease.    Anion gap 5 5 - 15  Glucose, capillary     Status: None   Collection Time: 06/30/14  6:38 AM  Result Value Ref Range   Glucose-Capillary 89 70 - 99 mg/dL   Comment 1 Notify RN   Prepare RBC     Status: None   Collection Time: 06/30/14  7:38 AM  Result Value Ref Range   Order Confirmation ORDER PROCESSED BY BLOOD BANK   Hemoglobin and hematocrit, blood     Status: Abnormal   Collection Time: 06/30/14 12:36 PM  Result Value Ref Range   Hemoglobin 9.0 (L) 13.0 - 17.0 g/dL    Comment: POST TRANSFUSION SPECIMEN   HCT 26.4 (L) 39.0 - 52.0 %  CBC     Status: Abnormal   Collection Time: 07/01/14  6:02 AM  Result Value Ref Range    WBC 12.6 (H) 4.0 - 10.5 K/uL   RBC 3.30 (L) 4.22 - 5.81 MIL/uL   Hemoglobin 8.7 (L) 13.0 - 17.0 g/dL   HCT 25.5 (L) 39.0 - 52.0 %   MCV 77.3 (L) 78.0 - 100.0 fL   MCH 26.4 26.0 - 34.0 pg   MCHC 34.1 30.0 - 36.0 g/dL   RDW 13.2 11.5 -  15.5 %   Platelets 146 (L) 150 - 400 K/uL  Basic metabolic panel     Status: Abnormal   Collection Time: 07/01/14  6:02 AM  Result Value Ref Range   Sodium 145 135 - 145 mmol/L    Comment: Please note change in reference range.   Potassium 3.7 3.5 - 5.1 mmol/L    Comment: Please note change in reference range.   Chloride 117 (H) 96 - 112 mEq/L   CO2 23 19 - 32 mmol/L   Glucose, Bld 114 (H) 70 - 99 mg/dL   BUN 8 6 - 23 mg/dL   Creatinine, Ser 0.85 0.50 - 1.35 mg/dL   Calcium 7.9 (L) 8.4 - 10.5 mg/dL   GFR calc non Af Amer >90 >90 mL/min   GFR calc Af Amer >90 >90 mL/min    Comment: (NOTE) The eGFR has been calculated using the CKD EPI equation. This calculation has not been validated in all clinical situations. eGFR's persistently <90 mL/min signify possible Chronic Kidney Disease.    Anion gap 5 5 - 15    Dg Pelvis 1-2 Views  06/28/2014   CLINICAL DATA:  Acute onset of right leg pain and swelling. Initial encounter.  EXAM: PELVIS - 1-2 VIEW  COMPARISON:  None.  FINDINGS: There is a comminuted right femoral intertrochanteric fracture, with a basicervical component, and medial rotation of the distal femur. Mildly displaced lesser and greater trochanteric fragments are seen. The right femoral head remains seated at the acetabulum. The left hip joint is grossly unremarkable. The sacroiliac joints are within normal limits. There is somewhat unusual marked soft tissue swelling along the proximal right thigh; would correlate clinically to exclude compartment syndrome.  The large amount of stool is noted filling the rectum, measuring 10.9 cm in transverse dimension, raising concern for fecal impaction. However, the remainder of the visualized bowel gas pattern  is grossly unremarkable.  IMPRESSION: 1. Comminuted right femoral intertrochanteric fracture, with a basicervical component, and medial rotation of the distal femur. Mildly displaced lesser and greater trochanteric fragments seen. 2. Somewhat unusual marked soft tissue swelling along the proximal right thigh; would correlate clinically to exclude compartment syndrome. 3. Large amount of stool noted filling the rectum, measuring 10.9 cm in transverse dimension, raising concern for fecal impaction. However, the remainder of the visualized bowel gas pattern is grossly unremarkable. These results were called by telephone at the time of interpretation on 06/28/2014 at 6:35 pm to the ER physician, who verbally acknowledged these results.   Electronically Signed   By: Garald Balding M.D.   On: 06/28/2014 18:35   Dg Femur Right  06/28/2014   CLINICAL DATA:  Seizure disorder and cerebral palsy. Right hip swelling.  EXAM: RIGHT FEMUR - 2 VIEW  COMPARISON:  None.  FINDINGS: There is an acute and comminuted fracture of the proximal right femur with displacement and angulation. The fracture plane is predominantly intertrochanteric. There is an oblique component that extends through the subtrochanteric proximal diaphysis. No dislocation. No underlying bony lesion is seen.  IMPRESSION: Acute and comminuted fracture of the proximal right femur which is primarily an intertrochanteric fracture. There is an oblique plane that does extend through the proximal diaphysis, as well, inferior to the trochanters.   Electronically Signed   By: Aletta Edouard M.D.   On: 06/28/2014 18:31   Dg C-arm 1-60 Min  06/29/2014   CLINICAL DATA:  ORIF RIGHT femoral fracture  EXAM: DG C-ARM 61-120 MIN; RADIOLOGY EXAMINATION  FLUOROSCOPY TIME:  2 minutes 13 seconds  COMPARISON:  06/28/2013  FINDINGS: Diffuse osseous demineralization.  IM nail with three screws placed across an intertrochanteric fracture of the proximal RIGHT femur.  No dislocation.  Two  distal locking screws are noted.  Visualized pelvis intact.  IMPRESSION: Post ORIF of intertrochanteric fracture RIGHT femur.   Electronically Signed   By: Lavonia Dana M.D.   On: 06/29/2014 16:53   Dg Femur, Min 2 Views Right  06/29/2014   CLINICAL DATA:  ORIF RIGHT femoral fracture  EXAM: DG C-ARM 61-120 MIN; RADIOLOGY EXAMINATION  FLUOROSCOPY TIME:  2 minutes 13 seconds  COMPARISON:  06/28/2013  FINDINGS: Diffuse osseous demineralization.  IM nail with three screws placed across an intertrochanteric fracture of the proximal RIGHT femur.  No dislocation.  Two distal locking screws are noted.  Visualized pelvis intact.  IMPRESSION: Post ORIF of intertrochanteric fracture RIGHT femur.   Electronically Signed   By: Lavonia Dana M.D.   On: 06/29/2014 16:53     Disposition: SNF  Diet: Regular  Activity: As tolerated per ortho.   Follow-up Appts:     Discharge Instructions    Diet general    Complete by:  As directed      Discharge instructions    Complete by:  As directed   Please followup with Lottie Mussel, MD (PCP) 1 week. Please followup with Ortho (Dr. Erlinda Hong) in 2-3 weeks, discuss with Dr. Erlinda Hong about when to discontinue aspirin 325 mg po twice daily.     Increase activity slowly    Complete by:  As directed      Weight bearing as tolerated    Complete by:  As directed            TESTS THAT NEED FOLLOW-UP None  Time spent on discharge, talking to the patient, and coordinating care: 25 mins.   Signed: Bynum Bellows, MD 07/01/2014, 12:03 PM

## 2014-07-02 DIAGNOSIS — R079 Chest pain, unspecified: Secondary | ICD-10-CM | POA: Diagnosis not present

## 2014-07-02 DIAGNOSIS — G40909 Epilepsy, unspecified, not intractable, without status epilepticus: Secondary | ICD-10-CM | POA: Diagnosis not present

## 2014-07-02 DIAGNOSIS — M84451S Pathological fracture, right femur, sequela: Secondary | ICD-10-CM | POA: Diagnosis not present

## 2014-07-02 DIAGNOSIS — R945 Abnormal results of liver function studies: Secondary | ICD-10-CM | POA: Diagnosis not present

## 2014-07-20 DIAGNOSIS — S72041D Displaced fracture of base of neck of right femur, subsequent encounter for closed fracture with routine healing: Secondary | ICD-10-CM | POA: Diagnosis not present

## 2014-07-20 DIAGNOSIS — S72141D Displaced intertrochanteric fracture of right femur, subsequent encounter for closed fracture with routine healing: Secondary | ICD-10-CM | POA: Diagnosis not present

## 2014-07-24 DIAGNOSIS — S72141D Displaced intertrochanteric fracture of right femur, subsequent encounter for closed fracture with routine healing: Secondary | ICD-10-CM | POA: Diagnosis not present

## 2014-07-24 DIAGNOSIS — G808 Other cerebral palsy: Secondary | ICD-10-CM | POA: Diagnosis not present

## 2014-07-24 DIAGNOSIS — Z9181 History of falling: Secondary | ICD-10-CM | POA: Diagnosis not present

## 2014-07-24 DIAGNOSIS — Z993 Dependence on wheelchair: Secondary | ICD-10-CM | POA: Diagnosis not present

## 2014-07-24 DIAGNOSIS — D649 Anemia, unspecified: Secondary | ICD-10-CM | POA: Diagnosis not present

## 2014-07-24 DIAGNOSIS — G4089 Other seizures: Secondary | ICD-10-CM | POA: Diagnosis not present

## 2014-07-27 DIAGNOSIS — Z9181 History of falling: Secondary | ICD-10-CM | POA: Diagnosis not present

## 2014-07-27 DIAGNOSIS — G808 Other cerebral palsy: Secondary | ICD-10-CM | POA: Diagnosis not present

## 2014-07-27 DIAGNOSIS — S72141D Displaced intertrochanteric fracture of right femur, subsequent encounter for closed fracture with routine healing: Secondary | ICD-10-CM | POA: Diagnosis not present

## 2014-07-27 DIAGNOSIS — Z993 Dependence on wheelchair: Secondary | ICD-10-CM | POA: Diagnosis not present

## 2014-07-27 DIAGNOSIS — D649 Anemia, unspecified: Secondary | ICD-10-CM | POA: Diagnosis not present

## 2014-07-27 DIAGNOSIS — G4089 Other seizures: Secondary | ICD-10-CM | POA: Diagnosis not present

## 2014-07-28 ENCOUNTER — Telehealth: Payer: Self-pay | Admitting: Internal Medicine

## 2014-07-28 NOTE — Telephone Encounter (Signed)
Call to patient to confirm appointment for 07/29/14 at 10:15. Patient Confirmed ° °

## 2014-07-29 ENCOUNTER — Ambulatory Visit (INDEPENDENT_AMBULATORY_CARE_PROVIDER_SITE_OTHER): Payer: Medicare Other | Admitting: Internal Medicine

## 2014-07-29 ENCOUNTER — Encounter: Payer: Self-pay | Admitting: Internal Medicine

## 2014-07-29 VITALS — BP 114/84 | HR 116 | Temp 98.3°F | Ht 67.0 in | Wt 110.0 lb

## 2014-07-29 DIAGNOSIS — Z0000 Encounter for general adult medical examination without abnormal findings: Secondary | ICD-10-CM

## 2014-07-29 DIAGNOSIS — G809 Cerebral palsy, unspecified: Secondary | ICD-10-CM

## 2014-07-29 DIAGNOSIS — Z993 Dependence on wheelchair: Secondary | ICD-10-CM | POA: Diagnosis not present

## 2014-07-29 DIAGNOSIS — G4089 Other seizures: Secondary | ICD-10-CM | POA: Diagnosis not present

## 2014-07-29 DIAGNOSIS — D649 Anemia, unspecified: Secondary | ICD-10-CM | POA: Diagnosis not present

## 2014-07-29 DIAGNOSIS — G808 Other cerebral palsy: Secondary | ICD-10-CM | POA: Diagnosis not present

## 2014-07-29 DIAGNOSIS — Z9181 History of falling: Secondary | ICD-10-CM | POA: Diagnosis not present

## 2014-07-29 DIAGNOSIS — D6489 Other specified anemias: Secondary | ICD-10-CM | POA: Diagnosis not present

## 2014-07-29 DIAGNOSIS — R569 Unspecified convulsions: Secondary | ICD-10-CM

## 2014-07-29 DIAGNOSIS — S7291XD Unspecified fracture of right femur, subsequent encounter for closed fracture with routine healing: Secondary | ICD-10-CM

## 2014-07-29 DIAGNOSIS — S72141D Displaced intertrochanteric fracture of right femur, subsequent encounter for closed fracture with routine healing: Secondary | ICD-10-CM | POA: Diagnosis not present

## 2014-07-29 LAB — CBC
HEMATOCRIT: 38.1 % — AB (ref 39.0–52.0)
Hemoglobin: 12.6 g/dL — ABNORMAL LOW (ref 13.0–17.0)
MCH: 25.7 pg — AB (ref 26.0–34.0)
MCHC: 33.1 g/dL (ref 30.0–36.0)
MCV: 77.8 fL — ABNORMAL LOW (ref 78.0–100.0)
MPV: 10.2 fL (ref 8.6–12.4)
PLATELETS: 232 10*3/uL (ref 150–400)
RBC: 4.9 MIL/uL (ref 4.22–5.81)
RDW: 14.1 % (ref 11.5–15.5)
WBC: 6.4 10*3/uL (ref 4.0–10.5)

## 2014-07-29 MED ORDER — CARBAMAZEPINE 200 MG PO TABS: 200.0000 mg | ORAL_TABLET | Freq: Every day | ORAL | Status: DC

## 2014-07-29 NOTE — Assessment & Plan Note (Signed)
Patient with a hemoglobin of 8.7 and MCV of 77.3 on 07/01/2014 which was 2 days status post fixation of his femoral fracture. Likely secondary to acute illness and operations. Patient has a history of iron studies from 2013 which were within normal limits. -Will check CBC again today.

## 2014-07-29 NOTE — Assessment & Plan Note (Signed)
Patient at baseline is nonverbal with significant contractures. Family states that they have procured a bed that is very low to the ground to reduce his falls risk. They also deny any falls since the one in the beginning of January that led to his femoral fracture. They state that he is back to his normal baseline of activity which is to crawl around on the ground at home.

## 2014-07-29 NOTE — Assessment & Plan Note (Signed)
Patient is up-to-date on his flu vaccine.

## 2014-07-29 NOTE — Progress Notes (Signed)
   Subjective:    Patient ID: Austin [name], male    DOB: 1976/04/13, 39 y.o.   MRN: [id number]  HPI  Patient is a 39 year old gentleman with a history of severe cerebral palsy, recent right femoral fracture status post intramedullary fixation (06/29/2014) who presents to clinic for a routine follow-up.  Please refer to separate problem-list charting for more details.   Review of Systems  Unable to perform ROS     Objective:   Physical Exam  Constitutional: No distress.  Rocking movements in a wheelchair  HENT:  Head: Normocephalic and atraumatic.  Significant scarring alopecia in the posterior aspect of the head  Eyes: EOM are normal. Pupils are equal, round, and reactive to light. No scleral icterus.  Neck: Normal range of motion. Neck supple. No thyromegaly present.  Cardiovascular: Exam reveals no gallop and no friction rub.   No murmur heard. Very difficult exam secondary to rocking movements  Pulmonary/Chest: Effort normal and breath sounds normal. No respiratory distress. He has no wheezes. He has no rales.  Exam limited by rocking movements  Abdominal: Soft. Bowel sounds are normal. He exhibits no distension. There is no tenderness. There is no rebound.  Musculoskeletal: Normal range of motion. He exhibits no edema.  No significant tenderness along the right hip or leg  Neurological: He is alert. No cranial nerve deficit.  Nonverbal, constant rocking movements  Skin: No rash noted.          Assessment & Plan:  Please refer to separate problem-list charting for more details.

## 2014-07-29 NOTE — Assessment & Plan Note (Signed)
Refilled carbamazepine.

## 2014-07-29 NOTE — Progress Notes (Signed)
Case discussed with Dr. Ngo at the time of the visit.  We reviewed the resident's history and exam and pertinent patient test results.  I agree with the assessment, diagnosis, and plan of care documented in the resident's note. 

## 2014-07-29 NOTE — Patient Instructions (Signed)
I will call you if your blood test has any abnormalities that would require us to change your medications or would require further tests. Otherwise, I am glad to see that you're doing well after your surgery.  General Instructions:   Please bring your medicines with you each time you come to clinic.  Medicines may include prescription medications, over-the-counter medications, herbal remedies, eye drops, vitamins, or other pills.   Progress Toward Treatment Goals:  No flowsheet data found.  Self Care Goals & Plans:  Self Care Goal 07/29/2014  Manage my medications take my medicines as prescribed; bring my medications to every visit; refill my medications on time    No flowsheet data found.   Care Management & Community Referrals:  Referral 03/12/2013  Referrals made to community resources none

## 2014-07-29 NOTE — Assessment & Plan Note (Signed)
Patient's pain has been well controlled and he is back at his normal baseline level of activity at home, being able to crawl around on the floor.  -Patient to follow-up with orthopedic surgery.

## 2014-07-30 DIAGNOSIS — D649 Anemia, unspecified: Secondary | ICD-10-CM | POA: Diagnosis not present

## 2014-07-30 DIAGNOSIS — Z9181 History of falling: Secondary | ICD-10-CM | POA: Diagnosis not present

## 2014-07-30 DIAGNOSIS — S72141D Displaced intertrochanteric fracture of right femur, subsequent encounter for closed fracture with routine healing: Secondary | ICD-10-CM | POA: Diagnosis not present

## 2014-07-30 DIAGNOSIS — G4089 Other seizures: Secondary | ICD-10-CM | POA: Diagnosis not present

## 2014-07-30 DIAGNOSIS — Z993 Dependence on wheelchair: Secondary | ICD-10-CM | POA: Diagnosis not present

## 2014-07-30 DIAGNOSIS — G808 Other cerebral palsy: Secondary | ICD-10-CM | POA: Diagnosis not present

## 2014-08-04 DIAGNOSIS — G4089 Other seizures: Secondary | ICD-10-CM | POA: Diagnosis not present

## 2014-08-04 DIAGNOSIS — G808 Other cerebral palsy: Secondary | ICD-10-CM | POA: Diagnosis not present

## 2014-08-04 DIAGNOSIS — Z993 Dependence on wheelchair: Secondary | ICD-10-CM | POA: Diagnosis not present

## 2014-08-04 DIAGNOSIS — Z9181 History of falling: Secondary | ICD-10-CM | POA: Diagnosis not present

## 2014-08-04 DIAGNOSIS — S72141D Displaced intertrochanteric fracture of right femur, subsequent encounter for closed fracture with routine healing: Secondary | ICD-10-CM | POA: Diagnosis not present

## 2014-08-04 DIAGNOSIS — D649 Anemia, unspecified: Secondary | ICD-10-CM | POA: Diagnosis not present

## 2014-08-05 DIAGNOSIS — D649 Anemia, unspecified: Secondary | ICD-10-CM | POA: Diagnosis not present

## 2014-08-05 DIAGNOSIS — G4089 Other seizures: Secondary | ICD-10-CM | POA: Diagnosis not present

## 2014-08-05 DIAGNOSIS — Z9181 History of falling: Secondary | ICD-10-CM | POA: Diagnosis not present

## 2014-08-05 DIAGNOSIS — Z993 Dependence on wheelchair: Secondary | ICD-10-CM | POA: Diagnosis not present

## 2014-08-05 DIAGNOSIS — G808 Other cerebral palsy: Secondary | ICD-10-CM | POA: Diagnosis not present

## 2014-08-05 DIAGNOSIS — S72141D Displaced intertrochanteric fracture of right femur, subsequent encounter for closed fracture with routine healing: Secondary | ICD-10-CM | POA: Diagnosis not present

## 2014-08-07 DIAGNOSIS — G808 Other cerebral palsy: Secondary | ICD-10-CM | POA: Diagnosis not present

## 2014-08-07 DIAGNOSIS — D649 Anemia, unspecified: Secondary | ICD-10-CM | POA: Diagnosis not present

## 2014-08-07 DIAGNOSIS — Z993 Dependence on wheelchair: Secondary | ICD-10-CM | POA: Diagnosis not present

## 2014-08-07 DIAGNOSIS — G4089 Other seizures: Secondary | ICD-10-CM | POA: Diagnosis not present

## 2014-08-07 DIAGNOSIS — S72141D Displaced intertrochanteric fracture of right femur, subsequent encounter for closed fracture with routine healing: Secondary | ICD-10-CM | POA: Diagnosis not present

## 2014-08-07 DIAGNOSIS — Z9181 History of falling: Secondary | ICD-10-CM | POA: Diagnosis not present

## 2014-08-11 DIAGNOSIS — Z9181 History of falling: Secondary | ICD-10-CM | POA: Diagnosis not present

## 2014-08-11 DIAGNOSIS — Z993 Dependence on wheelchair: Secondary | ICD-10-CM | POA: Diagnosis not present

## 2014-08-11 DIAGNOSIS — D649 Anemia, unspecified: Secondary | ICD-10-CM | POA: Diagnosis not present

## 2014-08-11 DIAGNOSIS — S72141D Displaced intertrochanteric fracture of right femur, subsequent encounter for closed fracture with routine healing: Secondary | ICD-10-CM | POA: Diagnosis not present

## 2014-08-11 DIAGNOSIS — G808 Other cerebral palsy: Secondary | ICD-10-CM | POA: Diagnosis not present

## 2014-08-11 DIAGNOSIS — G4089 Other seizures: Secondary | ICD-10-CM | POA: Diagnosis not present

## 2014-08-12 DIAGNOSIS — D649 Anemia, unspecified: Secondary | ICD-10-CM | POA: Diagnosis not present

## 2014-08-12 DIAGNOSIS — G808 Other cerebral palsy: Secondary | ICD-10-CM | POA: Diagnosis not present

## 2014-08-12 DIAGNOSIS — S72141D Displaced intertrochanteric fracture of right femur, subsequent encounter for closed fracture with routine healing: Secondary | ICD-10-CM | POA: Diagnosis not present

## 2014-08-12 DIAGNOSIS — Z993 Dependence on wheelchair: Secondary | ICD-10-CM | POA: Diagnosis not present

## 2014-08-12 DIAGNOSIS — G4089 Other seizures: Secondary | ICD-10-CM | POA: Diagnosis not present

## 2014-08-12 DIAGNOSIS — Z9181 History of falling: Secondary | ICD-10-CM | POA: Diagnosis not present

## 2014-08-14 DIAGNOSIS — D649 Anemia, unspecified: Secondary | ICD-10-CM | POA: Diagnosis not present

## 2014-08-14 DIAGNOSIS — S72141D Displaced intertrochanteric fracture of right femur, subsequent encounter for closed fracture with routine healing: Secondary | ICD-10-CM | POA: Diagnosis not present

## 2014-08-14 DIAGNOSIS — G4089 Other seizures: Secondary | ICD-10-CM | POA: Diagnosis not present

## 2014-08-14 DIAGNOSIS — Z993 Dependence on wheelchair: Secondary | ICD-10-CM | POA: Diagnosis not present

## 2014-08-14 DIAGNOSIS — G808 Other cerebral palsy: Secondary | ICD-10-CM | POA: Diagnosis not present

## 2014-08-14 DIAGNOSIS — Z9181 History of falling: Secondary | ICD-10-CM | POA: Diagnosis not present

## 2014-08-17 DIAGNOSIS — Z993 Dependence on wheelchair: Secondary | ICD-10-CM | POA: Diagnosis not present

## 2014-08-17 DIAGNOSIS — D649 Anemia, unspecified: Secondary | ICD-10-CM | POA: Diagnosis not present

## 2014-08-17 DIAGNOSIS — S72141D Displaced intertrochanteric fracture of right femur, subsequent encounter for closed fracture with routine healing: Secondary | ICD-10-CM | POA: Diagnosis not present

## 2014-08-17 DIAGNOSIS — G808 Other cerebral palsy: Secondary | ICD-10-CM | POA: Diagnosis not present

## 2014-08-17 DIAGNOSIS — Z9181 History of falling: Secondary | ICD-10-CM | POA: Diagnosis not present

## 2014-08-17 DIAGNOSIS — G4089 Other seizures: Secondary | ICD-10-CM | POA: Diagnosis not present

## 2014-08-19 DIAGNOSIS — G808 Other cerebral palsy: Secondary | ICD-10-CM | POA: Diagnosis not present

## 2014-08-19 DIAGNOSIS — Z993 Dependence on wheelchair: Secondary | ICD-10-CM | POA: Diagnosis not present

## 2014-08-19 DIAGNOSIS — S72141D Displaced intertrochanteric fracture of right femur, subsequent encounter for closed fracture with routine healing: Secondary | ICD-10-CM | POA: Diagnosis not present

## 2014-08-19 DIAGNOSIS — D649 Anemia, unspecified: Secondary | ICD-10-CM | POA: Diagnosis not present

## 2014-08-19 DIAGNOSIS — Z9181 History of falling: Secondary | ICD-10-CM | POA: Diagnosis not present

## 2014-08-19 DIAGNOSIS — G4089 Other seizures: Secondary | ICD-10-CM | POA: Diagnosis not present

## 2014-08-21 DIAGNOSIS — Z9181 History of falling: Secondary | ICD-10-CM | POA: Diagnosis not present

## 2014-08-21 DIAGNOSIS — G808 Other cerebral palsy: Secondary | ICD-10-CM | POA: Diagnosis not present

## 2014-08-21 DIAGNOSIS — S72141D Displaced intertrochanteric fracture of right femur, subsequent encounter for closed fracture with routine healing: Secondary | ICD-10-CM | POA: Diagnosis not present

## 2014-08-21 DIAGNOSIS — Z993 Dependence on wheelchair: Secondary | ICD-10-CM | POA: Diagnosis not present

## 2014-08-21 DIAGNOSIS — G4089 Other seizures: Secondary | ICD-10-CM | POA: Diagnosis not present

## 2014-08-21 DIAGNOSIS — D649 Anemia, unspecified: Secondary | ICD-10-CM | POA: Diagnosis not present

## 2014-10-22 ENCOUNTER — Encounter: Payer: Self-pay | Admitting: *Deleted

## 2015-10-18 ENCOUNTER — Other Ambulatory Visit: Payer: Self-pay | Admitting: Internal Medicine

## 2015-10-19 NOTE — Telephone Encounter (Signed)
Patient has not been seen in clinic since 07/2014. Needs follow up in clinic.

## 2015-10-20 ENCOUNTER — Telehealth: Payer: Self-pay

## 2015-10-20 DIAGNOSIS — R569 Unspecified convulsions: Secondary | ICD-10-CM

## 2015-10-20 NOTE — Telephone Encounter (Signed)
Please call pt sister back regarding carbamazepine med.

## 2015-10-20 NOTE — Telephone Encounter (Signed)
Have tried to call all #'s unable to leave message, ask pharm to relay message to pt's sister to call asap for an appt, refill was refused

## 2015-10-20 NOTE — Telephone Encounter (Signed)
i have tried all ph#, even 1 we did not have, called pharm they will try to pass message to pt's caregiver

## 2015-10-21 MED ORDER — CARBAMAZEPINE 200 MG PO TABS: 200.0000 mg | ORAL_TABLET | Freq: Every day | ORAL | Status: DC

## 2015-10-21 NOTE — Telephone Encounter (Signed)
Patient has not been seen in clinic in 14 months.  That being said, I do not see issues with no-shows.  There may have been some miscommunication on appropriate follow-up frequency.  He is scheduled for 12/22/2015 with his new PCP and should keep this appointment.  At that appointment we will better define the responsibilities of both parties on expected follow-up frequency and responsibility to prescribe medication if patient continues to do well and follows-up as felt appropriate.  I have refilled a 3 month supply of the medication to get the patient well past the scheduled appointment in case he needs to reschedule.  Further refills will require that he be seen in the clinic to assure the medication remains appropriate for him.  Please inform the relative that the carbamezapine has been refilled to cover the next 3 months, but it is important that they are seen in the clinic before the 3 month supply is exhausted.

## 2015-10-21 NOTE — Telephone Encounter (Signed)
Call from pt's sister-upset about not being able to get pt's carbamazepine filled. She was informed that medication had been refused because patient needs an office visit.  She stated that when she called to make an appt-she was informed that the next avail appt with pcp was on 12/22/15 and she told scheduler that patient needed an earlier appt, but was told that was all they had with pt's pcp.  She was unaware that she could have [facility] and was upset that she hadn't been informed of that option.  I offered to make a sooner appt for patient, but the other relative (that helps put patient in the car) lives 2 hours away and just started a new job. States pt only has one pill left and she is concerned about patient running out of his "seizure medications".  She has asked that I ask the attending MD-to see if another refill was possible.  Will send to attending for review, please advise.Criss AlvineGoldston, Hadessah Grennan Cassady4/27/201710:25 AM

## 2015-10-21 NOTE — Telephone Encounter (Signed)
Family informed about refill.  I stressed the importance of patient being seen prior to running out of refills.  Sister understands and agrees with plan and is in the process of finding out when another relative can assist her with bringing patient in.  Phone call complete.Austin [name], Austin Bango Cassady4/27/201711:54 AM

## 2015-11-05 ENCOUNTER — Encounter: Payer: Self-pay | Admitting: Internal Medicine

## 2015-11-05 ENCOUNTER — Ambulatory Visit (INDEPENDENT_AMBULATORY_CARE_PROVIDER_SITE_OTHER): Payer: Medicare Other | Admitting: Internal Medicine

## 2015-11-05 VITALS — BP 138/78 | HR 76 | Temp 98.1°F

## 2015-11-05 DIAGNOSIS — G40909 Epilepsy, unspecified, not intractable, without status epilepticus: Secondary | ICD-10-CM | POA: Diagnosis not present

## 2015-11-05 DIAGNOSIS — L662 Folliculitis decalvans: Secondary | ICD-10-CM | POA: Diagnosis not present

## 2015-11-05 DIAGNOSIS — D509 Iron deficiency anemia, unspecified: Secondary | ICD-10-CM | POA: Insufficient documentation

## 2015-11-05 DIAGNOSIS — Z79899 Other long term (current) drug therapy: Secondary | ICD-10-CM | POA: Diagnosis not present

## 2015-11-05 DIAGNOSIS — L219 Seborrheic dermatitis, unspecified: Secondary | ICD-10-CM

## 2015-11-05 DIAGNOSIS — R569 Unspecified convulsions: Secondary | ICD-10-CM

## 2015-11-05 MED ORDER — CLINDAMYCIN PHOSPHATE 1 % EX SOLN: Freq: Two times a day (BID) | CUTANEOUS | Status: DC

## 2015-11-05 MED ORDER — DOXYCYCLINE HYCLATE 50 MG PO CAPS: ORAL_CAPSULE | ORAL | Status: DC

## 2015-11-05 MED ORDER — KETOCONAZOLE 2 % EX CREA: TOPICAL_CREAM | Freq: Two times a day (BID) | CUTANEOUS | Status: DC

## 2015-11-05 MED ORDER — CARBAMAZEPINE 200 MG PO TABS: 200.0000 mg | ORAL_TABLET | Freq: Every day | ORAL | Status: DC

## 2015-11-05 NOTE — Progress Notes (Addendum)
Patient ID: Austin [name] Sendejo, male   DOB: June 14, 1976, 40 y.o.   MRN: [id number] [facility] Subjective:   Patient ID: Austin [name] Popson male   DOB: June 14, 1976 40 y.o.   MRN: [id number]  HPI: Mr.Deepak Mayford KnifeWilliams is a 40 y.o. male with cerebral palsy, seizure disorder, folliculitis decalvans, and seborrheic dermatitis here for follow-up of the latter three medical problems.  Please see the assessment and plan for the status of the patient's chronic medical problems.  I have reviewed his medications with him today and he does not smoke.  Review of Systems  Unable to perform ROS: language   Objective:  Physical Exam: Filed Vitals:   11/05/15 1037  BP: 138/78  Pulse: 76  Temp: 98.1 F (36.7 C)  TempSrc: Axillary  SpO2: 95%   General: resting in wheelchair comfortably, rocking back in forth, making eye contact but does not speak HEENT: no scleral icterus, extra-ocular muscles intact, oropharynx without lesions Cardiac: regular rate and rhythm, no rubs, murmurs or gallops Pulm: breathing well, clear to auscultation bilaterally Abd: bowel sounds normal, soft, nondistended, non-tender Ext: warm and well perfused, without pedal edema Lymph: no cervical or supraclavicular lymphadenopathy Skin: 1. On the posterior scalp, there is diffuse scarring alopecia with peripheral folliculocentric pustules and doll-hair tufting 2. On the eyebrows and nasolabial folds, there are scaly erythematous plaques Neuro: alert and oriented X3, cranial nerves II-XII grossly intact, moving all extremities well  Assessment & Plan:  Case discussed with Dr. Rogelia BogaButcher  Folliculitis decalvans History: He has had alopecia with red bumps on the outside of the border for many years. His sister tells me that sometimes he scratches at these and it seems to bother him. [place] Vaseline; he has never been on an oral antibiotic.  Assessment: He has  scarring alopecia with peripheral pustules and doll hair tufting, consistent with folliculitis decalvans. This is classically a painful form of scarring alopecia thought to be caused by an autoimmune reaction to Staphylococcus aureus. Although he cannot tell me this is painful, he does scratch at it, so I think treatment could help him. Ideally, I would have cultured one of these pustules, but it is very difficult for him to sit still swells unable to do this today.  Plan: We have started doxycycline 50 mg twice daily for 3 months in addition to topical clindamycin 1% gel twice daily. If this continues to be bothersome for him, we can consider starting clindamycin and rifampin, as this is second line therapy. I told his sister, the caretaker, that if he develops diarrhea on the doxycycline, to give us a call and stop the medication.  Seizure History: He has not had a seizure in the last 2 years on carbamazepine 200 mg twice daily. A neurologist had tried a different antiseizure medication, but this caused him to hallucinate and worsened his agitation. He has done very well on carbamazepine.  Assessment: He is doing well on a missing, so I see no need to change it. I will check a CBC to ensure he does not have agranulocytosis.  Plan: I have written him a one-year prescription of carbamazepine 200 mg twice daily, and checked a CBC to ensure he does not have agranulocytosis. We can see him back in one year to check another CBC.  Microcytic anemia History: He is not able to walk, but his daughter says he has not seemed to be dizzy when he stands up. She denies any  melena.  Assessment: His microcytic anemia from last year had improved from the year prior when he had his surgery for his femur fracture. I suspect this was related to blood loss from his surgery. He may have alpha thalassemia.  Plan: I'll check another CBC and ferritin today. If he is truly iron deficient, I'll offer colonoscopy to the family  as well as iron supplementation.  Addendum: Fortunately his hemoglobin stabilized and ferritin was normal. I think we can continue with yearly CBCs to screen for agranulocytosis while on carbamezapine.  Seborrheic dermatitis History: He has a scaly red rash in between his nose and scan and all over his eyebrows. His sister tells me he sometimes scratches at these.  Assessment: He has quite severe seborrheic dermatitis; because he scratches at this, I think treatment could be beneficial.  Plan: We have started topical ketoconazole 2% ointment where he has a scaly red rash.    Medications Ordered Meds ordered this encounter  Medications  . carbamazepine (TEGRETOL) 200 MG tablet    Sig: Take 1 tablet (200 mg total) by mouth daily.    Dispense:  90 tablet    Refill:  3  . ketoconazole (NIZORAL) 2 % cream    Sig: Apply topically 2 (two) times daily. Apply to flaky skin on face for 4 weeks    Dispense:  60 g    Refill:  1  . doxycycline (VIBRAMYCIN) 50 MG capsule    Sig: Take 1 capsule twice daily for 3 months    Dispense:  180 capsule    Refill:  0  . clindamycin (CLEOCIN-T) 1 % external solution    Sig: Apply topically 2 (two) times daily.    Dispense:  30 mL    Refill:  0   Other Orders Orders Placed This Encounter  Procedures  . CBC with Diff  . Ferritin   Follow Up: Return in about 1 year (around 11/04/2016) for general follow-up with PCP.

## 2015-11-05 NOTE — Assessment & Plan Note (Signed)
History: He has a scaly red rash in between his nose and scan and all over his eyebrows. His sister tells me he sometimes scratches at these.  Assessment: He has quite severe seborrheic dermatitis; because he scratches at this, I think treatment could be beneficial.  Plan: We have started topical ketoconazole 2% ointment where he has a scaly red rash.

## 2015-11-05 NOTE — Assessment & Plan Note (Addendum)
History: He is not able to walk, but his daughter says he has not seemed to be dizzy when he stands up. She denies any melena.  Assessment: His microcytic anemia from last year had improved from the year prior when he had his surgery for his femur fracture. I suspect this was related to blood loss from his surgery. He may have alpha thalassemia.  Plan: I'll check another CBC and ferritin today. If he is truly iron deficient, I'll offer colonoscopy to the family as well as iron supplementation.  Addendum: Fortunately his hemoglobin stabilized and ferritin was normal. I think we can continue with yearly CBCs to screen for agranulocytosis while on carbamezapine.

## 2015-11-05 NOTE — Assessment & Plan Note (Signed)
History: He has not had a seizure in the last 2 years on carbamazepine 200 mg twice daily. A neurologist had tried a different antiseizure medication, but this caused him to hallucinate and worsened his agitation. He has done very well on carbamazepine.  Assessment: He is doing well on a missing, so I see no need to change it. I will check a CBC to ensure he does not have agranulocytosis.  Plan: I have written him a one-year prescription of carbamazepine 200 mg twice daily, and checked a CBC to ensure he does not have agranulocytosis. We can see him back in one year to check another CBC.

## 2015-11-05 NOTE — Assessment & Plan Note (Signed)
History: He has had alopecia with red bumps on the outside of the border for many years. His sister tells me that sometimes he scratches at these and it seems to bother him. [place] recommended Vaseline; he has never been on an oral antibiotic.  Assessment: He has scarring alopecia with peripheral pustules and doll hair tufting, consistent with folliculitis decalvans. This is classically a painful form of scarring alopecia thought to be caused by an autoimmune reaction to Staphylococcus aureus. Although he cannot tell me this is painful, he does scratch at it, so I think treatment could help him. Ideally, I would have cultured one of these pustules, but it is very difficult for him to sit still swells unable to do this today.  Plan: We have started doxycycline 50 mg twice daily for 3 months in addition to topical clindamycin 1% gel twice daily. If this continues to be bothersome for him, we can consider starting clindamycin and rifampin, as this is second line therapy. I told his sister, the caretaker, that if he develops diarrhea on the doxycycline, to give us a call and stop the medication.

## 2015-11-06 LAB — CBC WITH DIFFERENTIAL/PLATELET
BASOS ABS: 0 10*3/uL (ref 0.0–0.2)
Basos: 0 %
EOS (ABSOLUTE): 0.1 10*3/uL (ref 0.0–0.4)
Eos: 2 %
HEMATOCRIT: 39.1 % (ref 37.5–51.0)
Hemoglobin: 13.2 g/dL (ref 12.6–17.7)
IMMATURE GRANULOCYTES: 0 %
Immature Grans (Abs): 0 10*3/uL (ref 0.0–0.1)
Lymphocytes Absolute: 1.1 10*3/uL (ref 0.7–3.1)
Lymphs: 21 %
MCH: 26.4 pg — AB (ref 26.6–33.0)
MCHC: 33.8 g/dL (ref 31.5–35.7)
MCV: 78 fL — AB (ref 79–97)
MONOS ABS: 0.4 10*3/uL (ref 0.1–0.9)
Monocytes: 8 %
NEUTROS PCT: 69 %
Neutrophils Absolute: 3.7 10*3/uL (ref 1.4–7.0)
Platelets: 231 10*3/uL (ref 150–379)
RBC: 5 x10E6/uL (ref 4.14–5.80)
RDW: 13.5 % (ref 12.3–15.4)
WBC: 5.3 10*3/uL (ref 3.4–10.8)

## 2015-11-06 LAB — FERRITIN: FERRITIN: 95 ng/mL (ref 30–400)

## 2015-11-08 ENCOUNTER — Encounter: Payer: Self-pay | Admitting: Internal Medicine

## 2015-11-08 NOTE — Progress Notes (Signed)
Internal Medicine Clinic Attending  Case discussed with Dr. Flores at the time of the visit.  We reviewed the resident's history and exam and pertinent patient test results.  I agree with the assessment, diagnosis, and plan of care documented in the resident's note. 

## 2015-12-22 ENCOUNTER — Encounter: Payer: Self-pay | Admitting: Internal Medicine

## 2016-01-11 IMAGING — CR DG PELVIS 1-2V
1 series · 1 of 1 positions shown · non-contrast
Comparison: None.

CLINICAL DATA: Acute onset of right leg pain and swelling. Initial
encounter.

EXAM:
PELVIS - 1-2 VIEW

[pelvis ap]
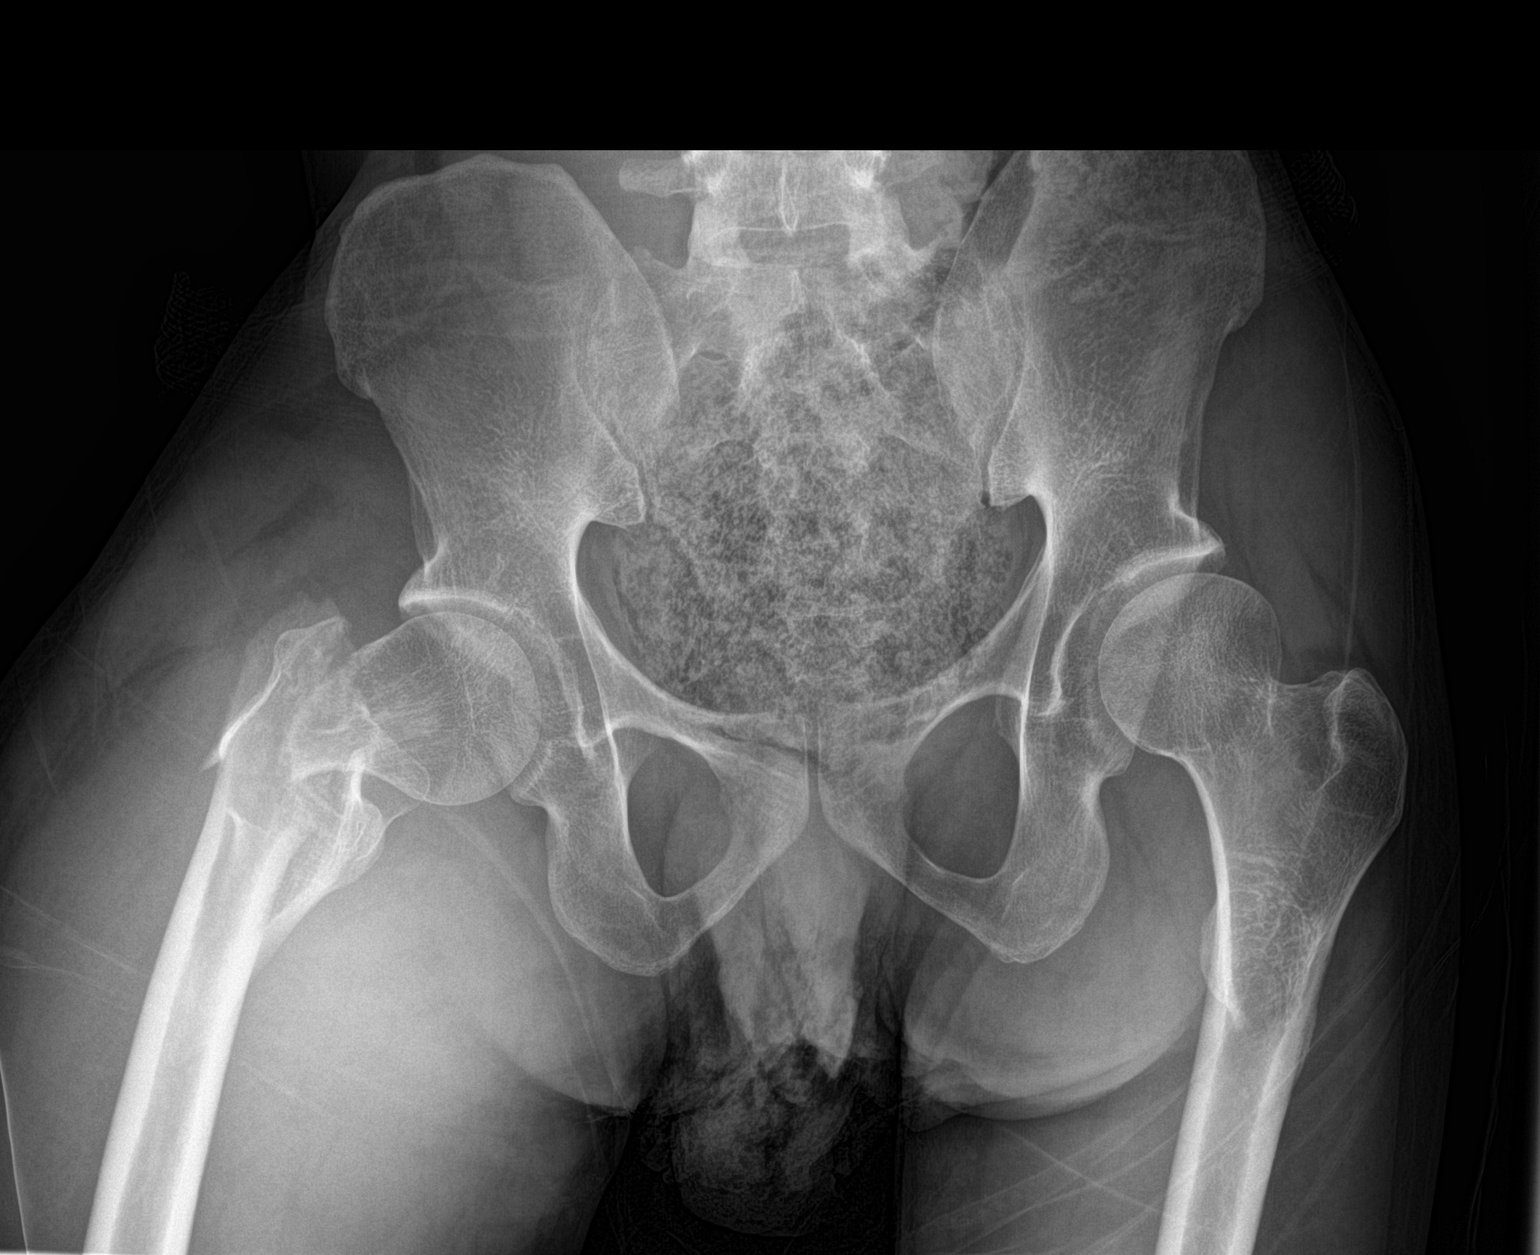

[1 of 1 positions shown; findings below may reference images not displayed]

FINDINGS: There is a comminuted right femoral intertrochanteric fracture, with
a basicervical component, and medial rotation of the distal femur.
Mildly displaced lesser and greater trochanteric fragments are seen.
The right femoral head remains seated at the acetabulum. The left
hip joint is grossly unremarkable. The sacroiliac joints are within
normal limits. There is somewhat unusual marked soft tissue swelling
along the proximal right thigh; would correlate clinically to
exclude compartment syndrome.

The large amount of stool is noted filling the rectum, measuring
10.9 cm in transverse dimension, raising concern for fecal
impaction. However, the remainder of the visualized bowel gas
pattern is grossly unremarkable.
IMPRESSION: 1. Comminuted right femoral intertrochanteric fracture, with a
basicervical component, and medial rotation of the distal femur.
Mildly displaced lesser and greater trochanteric fragments seen.
2. Somewhat unusual marked soft tissue swelling along the proximal
right thigh; would correlate clinically to exclude compartment
syndrome.
3. Large amount of stool noted filling the rectum, measuring 10.9 cm
in transverse dimension, raising concern for fecal impaction.
However, the remainder of the visualized bowel gas pattern is
grossly unremarkable.
These results were called by telephone at the time of interpretation
on 06/28/2014 at [DATE] to the ER physician, who verbally
acknowledged these results.

## 2016-01-11 IMAGING — CR DG FEMUR 2+V*R*
4 series · 4 of 4 positions shown · non-contrast
Comparison: None.

CLINICAL DATA: Seizure disorder and cerebral palsy. Right hip
swelling.

EXAM:
RIGHT FEMUR - 2 VIEW

[femur ap (1 of 2)]
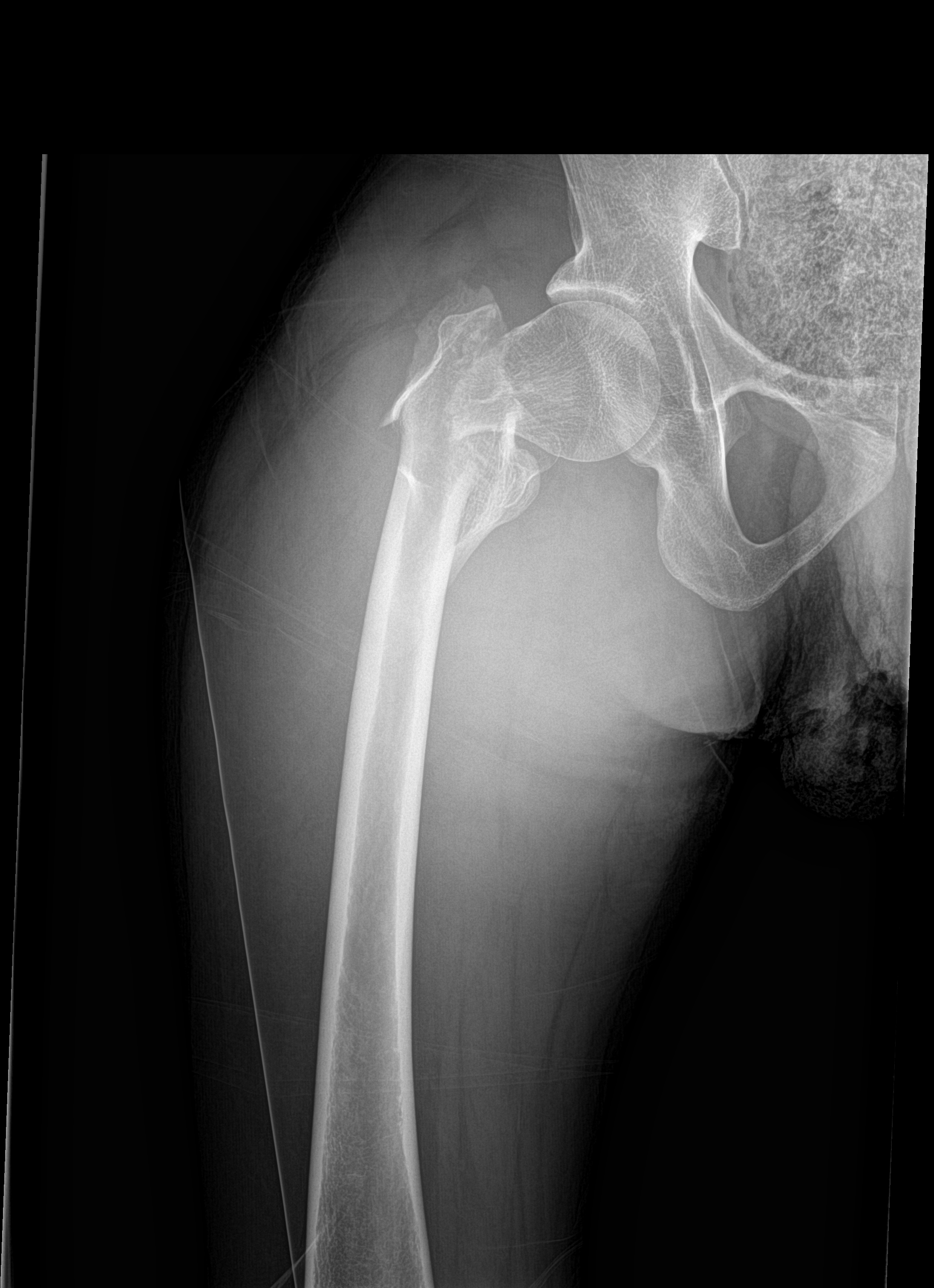

[femur ap (2 of 2)]
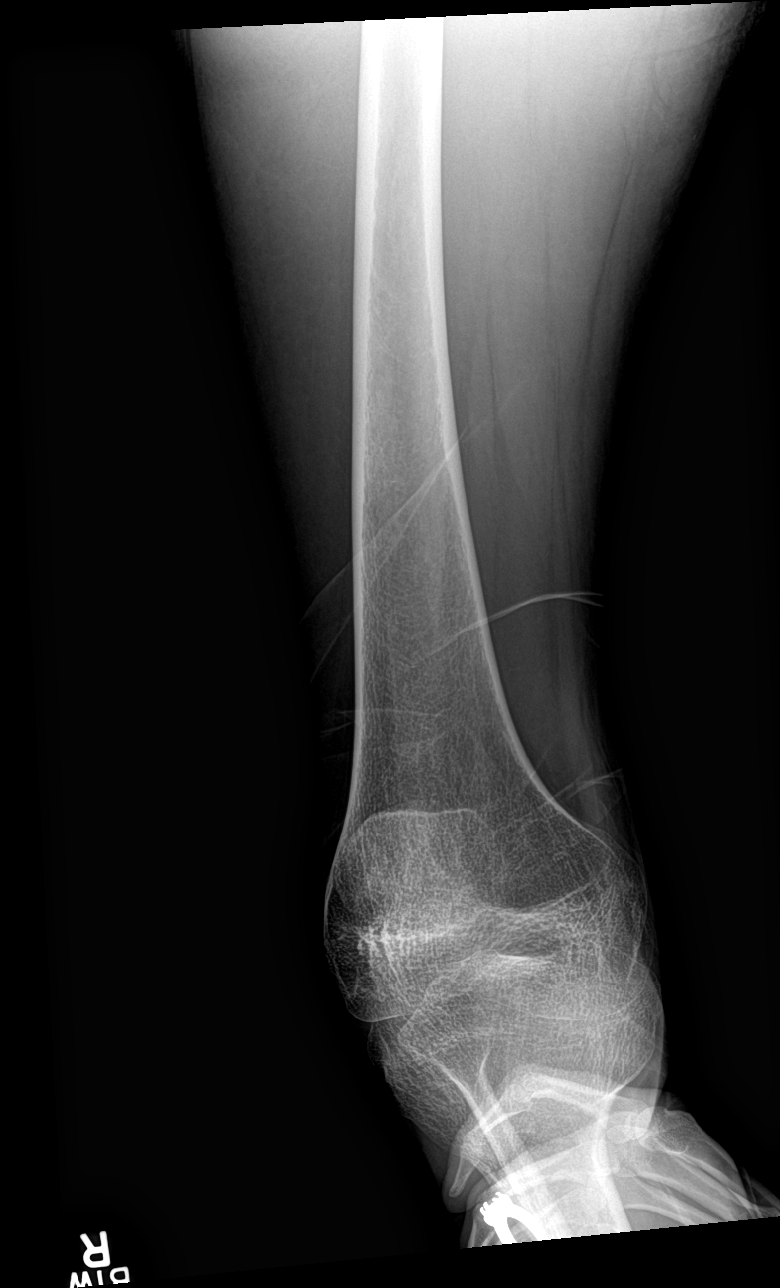

[femur lat (1 of 2)]
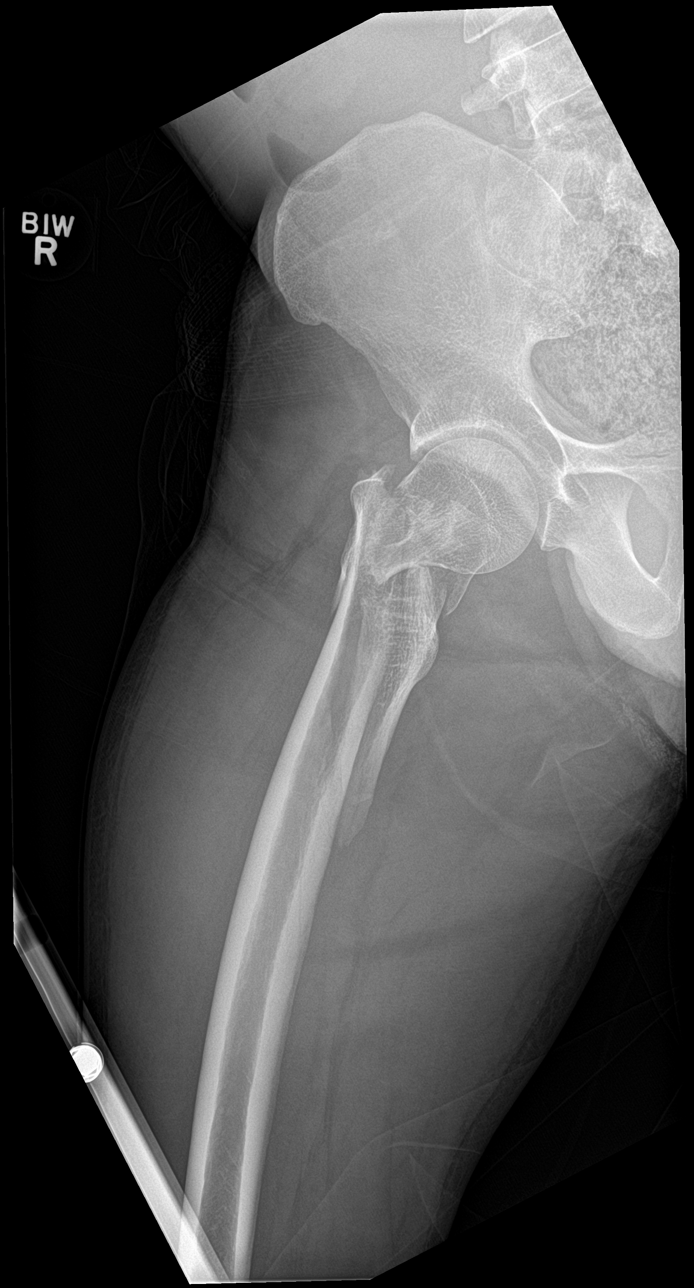

[femur lat (2 of 2)]
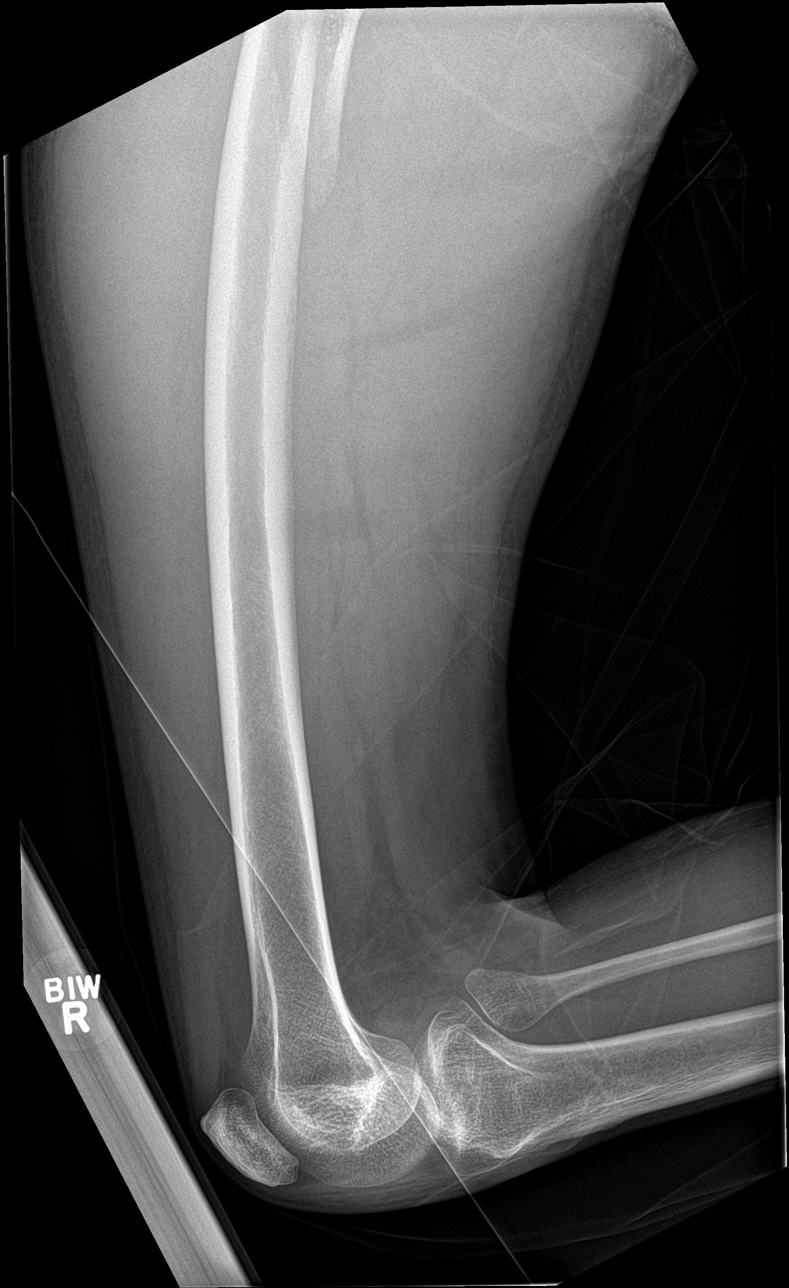

[4 of 4 positions shown; findings below may reference images not displayed]

FINDINGS: There is an acute and comminuted fracture of the proximal right
femur with displacement and angulation. The fracture plane is
predominantly intertrochanteric. There is an oblique component that
extends through the subtrochanteric proximal diaphysis. No
dislocation. No underlying bony lesion is seen.
IMPRESSION: Acute and comminuted fracture of the proximal right femur which is
primarily an intertrochanteric fracture. There is an oblique plane
that does extend through the proximal diaphysis, as well, inferior
to the trochanters.

## 2016-07-07 ENCOUNTER — Telehealth: Payer: Self-pay

## 2016-10-03 ENCOUNTER — Telehealth: Payer: Self-pay | Admitting: Internal Medicine

## 2016-10-03 NOTE — Telephone Encounter (Signed)
Calling to confirm appointment for 10/04/16 at 1:15 [facility]

## 2016-10-04 ENCOUNTER — Ambulatory Visit (INDEPENDENT_AMBULATORY_CARE_PROVIDER_SITE_OTHER): Payer: Medicare Other | Admitting: Internal Medicine

## 2016-10-04 ENCOUNTER — Encounter: Payer: Self-pay | Admitting: Internal Medicine

## 2016-10-04 ENCOUNTER — Encounter (INDEPENDENT_AMBULATORY_CARE_PROVIDER_SITE_OTHER): Payer: Self-pay

## 2016-10-04 DIAGNOSIS — R569 Unspecified convulsions: Secondary | ICD-10-CM | POA: Diagnosis not present

## 2016-10-04 DIAGNOSIS — G40909 Epilepsy, unspecified, not intractable, without status epilepticus: Secondary | ICD-10-CM | POA: Diagnosis not present

## 2016-10-04 DIAGNOSIS — R030 Elevated blood-pressure reading, without diagnosis of hypertension: Secondary | ICD-10-CM

## 2016-10-04 DIAGNOSIS — Z79899 Other long term (current) drug therapy: Secondary | ICD-10-CM

## 2016-10-04 DIAGNOSIS — L669 Cicatricial alopecia, unspecified: Secondary | ICD-10-CM

## 2016-10-04 DIAGNOSIS — D509 Iron deficiency anemia, unspecified: Secondary | ICD-10-CM | POA: Diagnosis not present

## 2016-10-04 DIAGNOSIS — Z993 Dependence on wheelchair: Secondary | ICD-10-CM

## 2016-10-04 MED ORDER — CARBAMAZEPINE 200 MG PO TABS: 200.0000 mg | ORAL_TABLET | Freq: Two times a day (BID) | ORAL | 3 refills | Status: DC

## 2016-10-04 NOTE — Assessment & Plan Note (Signed)
No evidence of anemia on most recent CBC 10/2015. Ferritin at that time was also wnl. Likely was secondary to acute blood loss from his surgery in 2016.

## 2016-10-04 NOTE — Progress Notes (Addendum)
   CC: Seizure follow up  HPI:  Mr.Cash Corella is a 41 y.o. male with a past medical history listed below here today for follow up of his seizure disorder.  For details of today's visit and the status of his chronic medical issues please refer to the assessment and plan.  Past Medical History:  Diagnosis Date  . Cerebral palsy (HCC)   . Nonspecific elevation of levels of transaminase and lactic acid dehydrogenase (LDH)   . Seizure disorder [facility])     Review of Systems:   See HPI  Physical Exam:  Vitals:   10/04/16 1321 10/04/16 1349  BP: (!) 155/91 (!) 146/86  Pulse: 70 (!) 102  Temp: 98.6 F (37 C)   TempSrc: Axillary   SpO2: 98%   Weight: 120 lb (54.4 kg)   Height:  (1.702 m)    Physical Exam  Constitutional:  Sitting upright in his wheelchair, rocking back and forth, moaning. Makes eye contact. Non-verbal.  HENT:  Head: Normocephalic and atraumatic.  Cardiovascular: Normal rate, regular rhythm and normal heart sounds.   Pulmonary/Chest: Effort normal and breath sounds normal. He has no wheezes. He has no rales.  Abdominal: Soft. Bowel sounds are normal. He exhibits no distension. There is no tenderness. There is no rebound.  Skin:  Posterior scalp with large area alopecia with diffuse scaring    Assessment & Plan:   See Encounters Tab for problem based charting.  Patient discussed with Dr. Heide Spark

## 2016-10-04 NOTE — Assessment & Plan Note (Signed)
BP Readings from Last 3 Encounters:  10/04/16 (!) 146/86  11/05/15 138/78  07/29/14 114/84    Lab Results  Component Value Date   NA 145 07/01/2014   K 3.7 07/01/2014   CREATININE 0.85 07/01/2014   BP was elevated today at 155/91 initially. Improved to 146/86 on re-check. Patient has no history of elevated blood pressure. He was not very cooperative with the blood pressure cuff and agitated. Likely falsely elevated BP given his agitation and lack of cooperation.   Assessment: Elevated blood pressure reading  Plan: Caregiver reports she will check periodically at home and bring him back if he remains elevated. Given lack of history of elevated blood pressure and poor cooperation today, likely falsely elevated.

## 2016-10-04 NOTE — Patient Instructions (Signed)
[name], Maney are doing great! I don't think we need to make any changes today.  I would suggest checking his blood pressure periodically at home. If he is staying less than 130/90 it should not be a problem but if it is staying elevated please let us know.   Please return to see [place] back in a year.

## 2016-10-04 NOTE — Assessment & Plan Note (Addendum)
Patient is non-verbal. Mother, who is the primary caregiver, is with him today and provides the history. She reports that he has had 1-2 seizures in the past year since his last visit. Reports he becomes stiff and unresponsive during seizures and is very tired and wiped out afterwards. She reports he has been on the carbamazepine 200 mg bid for about a decade. Has tried other medications but unable to tolerate due to side effects. He is not followed by neurology. She reports being happy on his current medications and has no interest in changing anything or seeing a neurologist.   CBC done at 11/05/2015 visit with no evidence of agranulocytosis.   Assessment: Seizure disorder  Plan: Continue carbamazepine 200 mg bid Checking CBC and BMET today

## 2016-10-05 LAB — BMP8+ANION GAP
ANION GAP: 13 mmol/L (ref 10.0–18.0)
BUN / CREAT RATIO: 10 (ref 9–20)
BUN: 9 mg/dL (ref 6–24)
CO2: 26 mmol/L (ref 18–29)
CREATININE: 0.86 mg/dL (ref 0.76–1.27)
Calcium: 8.9 mg/dL (ref 8.7–10.2)
Chloride: 103 mmol/L (ref 96–106)
GFR, EST AFRICAN AMERICAN: 124 mL/min/{1.73_m2} (ref >59)
GFR, EST NON AFRICAN AMERICAN: 107 mL/min/{1.73_m2} (ref >59)
Glucose: 76 mg/dL (ref 65–99)
POTASSIUM: 4 mmol/L (ref 3.5–5.2)
SODIUM: 142 mmol/L (ref 134–144)

## 2016-10-05 LAB — CBC
Hematocrit: 39.2 % (ref 37.5–51.0)
Hemoglobin: 13.5 g/dL (ref 13.0–17.7)
MCH: 26.9 pg (ref 26.6–33.0)
MCHC: 34.4 g/dL (ref 31.5–35.7)
MCV: 78 fL — AB (ref 79–97)
Platelets: 226 10*3/uL (ref 150–379)
RBC: 5.01 x10E6/uL (ref 4.14–5.80)
RDW: 13.5 % (ref 12.3–15.4)
WBC: 5.9 10*3/uL (ref 3.4–10.8)

## 2016-10-05 NOTE — Progress Notes (Signed)
Internal Medicine Clinic Attending  Case discussed with Dr. Boswell at the time of the visit.  We reviewed the resident's history and exam and pertinent patient test results.  I agree with the assessment, diagnosis, and plan of care documented in the resident's note.  

## 2017-10-05 ENCOUNTER — Other Ambulatory Visit: Payer: Self-pay | Admitting: Internal Medicine

## 2017-10-05 DIAGNOSIS — G40909 Epilepsy, unspecified, not intractable, without status epilepticus: Secondary | ICD-10-CM

## 2017-10-23 NOTE — Assessment & Plan Note (Signed)
Checking lipid panel today. 

## 2017-10-23 NOTE — Assessment & Plan Note (Addendum)
Mother notes that he has seizures every 4 to 5 months.  She reports the episodes last 2 to 3 minutes in duration.  He never loses consciousness during the episodes but does have generalized convulsions.  She reports that if they last longer she would call EMS and taken to the emergency department.  She has not had a do this for numerous years. Has been stable on Carbamezapine 200 mg bid for the past decade plus.   A/P Continue current medications. Check CMET, CBC today.

## 2017-10-23 NOTE — Progress Notes (Signed)
   CC: Seizure disorder  HPI:  [name] is a 42 y.o. male with a past medical history listed below here today for follow up of his seizure disorder.   For details of today's visit and the status of his chronic medical issues please refer to the assessment and plan.   Past Medical History:  Diagnosis Date  . Cerebral palsy (HCC)   . Nonspecific elevation of levels of transaminase and lactic acid dehydrogenase (LDH)   . Seizure disorder (HCC)    Review of Systems:  No chest pain or shortness of breath  Physical Exam:  Vitals:   10/24/17 1316  BP: (!) 145/91  Pulse: 81  Temp: 98.7 F (37.1 C)  TempSrc: Axillary  SpO2: 100%   GENERAL- alert, minimally co-operative examination, not in any distress.  Sitting upright in wheelchair rocking back and forth, makes eye contact but is nonverbal.  Thin, frail. HEENT- Atraumatic, normocephalic, PERRL, EOMI, oral mucosa appears moist CARDIAC- RRR, no murmurs, rubs or gallops. RESP- Moving equal volumes of air, and clear to auscultation bilaterally, no wheezes or crackles. ABDOMEN- Soft, nontender, bowel sounds present. NEURO- No obvious Cr N abnormality. EXTREMITIES- pulse 2+, symmetric, no pedal edema. SKIN- Warm, dry.  Posterior scalp with large area of alopecia and scarring.  No erythema or warmth or other signs of active infection.   Assessment & Plan:   See Encounters Tab for problem based charting.  Patient discussed with Dr. Cleda Daub

## 2017-10-23 NOTE — Assessment & Plan Note (Addendum)
BP Readings from Last 3 Encounters:  10/24/17 (!) 145/91  10/04/16 (!) 146/86  11/05/15 138/78    Lab Results  Component Value Date   NA 142 10/04/2016   K 4.0 10/04/2016   CREATININE 0.86 10/04/2016   Blood pressure is again elevated today at 145/91 with a pulse of 81.  It is difficult to obtain an accurate blood pressure on him as he becomes agitated whenever we placed a blood pressure cuff.  He shakes his arm or leg whenever we placed the cuff.  His mother, primary caregiver, reports she does not check his pressure at home because becomes agitated.  A/P His pressures have been elevated however do not feel these are accurate due to his degree of agitation.  Recommended to the mother to place the blood pressure cuff on him at home and let him rest in a quiet environment before checking his blood pressure to see if we can get an accurate reading.  If his pressures remain elevated with this to contact us and we can initiate blood pressure medications.

## 2017-10-24 ENCOUNTER — Encounter: Payer: Self-pay | Admitting: Internal Medicine

## 2017-10-24 ENCOUNTER — Other Ambulatory Visit: Payer: Self-pay

## 2017-10-24 ENCOUNTER — Ambulatory Visit (INDEPENDENT_AMBULATORY_CARE_PROVIDER_SITE_OTHER): Payer: Medicare Other | Admitting: Internal Medicine

## 2017-10-24 DIAGNOSIS — L659 Nonscarring hair loss, unspecified: Secondary | ICD-10-CM

## 2017-10-24 DIAGNOSIS — R030 Elevated blood-pressure reading, without diagnosis of hypertension: Secondary | ICD-10-CM

## 2017-10-24 DIAGNOSIS — G40909 Epilepsy, unspecified, not intractable, without status epilepticus: Secondary | ICD-10-CM | POA: Diagnosis not present

## 2017-10-24 DIAGNOSIS — D509 Iron deficiency anemia, unspecified: Secondary | ICD-10-CM | POA: Diagnosis not present

## 2017-10-24 DIAGNOSIS — Z0000 Encounter for general adult medical examination without abnormal findings: Secondary | ICD-10-CM | POA: Diagnosis not present

## 2017-10-24 DIAGNOSIS — G809 Cerebral palsy, unspecified: Secondary | ICD-10-CM | POA: Diagnosis not present

## 2017-10-24 NOTE — Patient Instructions (Signed)
[name], [name] are doing great.  We will work on try to get you set up with outpatient physical therapy.  Rechecking some labs today for any questions or concerns please call us otherwise he can follow-up with [place] in 1 year.

## 2017-10-24 NOTE — Assessment & Plan Note (Signed)
Patient contracted today.  Does have some muscle sting on exam.  Recommend his mother supplement his diet with nutritional shakes.  Will refer to outpatient physical therapy.

## 2017-10-25 LAB — CMP14 + ANION GAP
ALT: 13 IU/L (ref 0–44)
AST: 14 IU/L (ref 0–40)
Albumin/Globulin Ratio: 1.2 (ref 1.2–2.2)
Albumin: 4.1 g/dL (ref 3.5–5.5)
Alkaline Phosphatase: 99 IU/L (ref 39–117)
Anion Gap: 13 mmol/L (ref 10.0–18.0)
BUN/Creatinine Ratio: 11 (ref 9–20)
BUN: 10 mg/dL (ref 6–24)
Bilirubin Total: <0.2 mg/dL (ref 0.0–1.2)
CO2: 27 mmol/L (ref 20–29)
Calcium: 9.3 mg/dL (ref 8.7–10.2)
Chloride: 103 mmol/L (ref 96–106)
Creatinine, Ser: 0.92 mg/dL (ref 0.76–1.27)
GFR calc Af Amer: 117 mL/min/{1.73_m2} (ref >59)
GFR calc non Af Amer: 102 mL/min/{1.73_m2} (ref >59)
Globulin, Total: 3.3 g/dL (ref 1.5–4.5)
Glucose: 81 mg/dL (ref 65–99)
Potassium: 4.1 mmol/L (ref 3.5–5.2)
Sodium: 143 mmol/L (ref 134–144)
Total Protein: 7.4 g/dL (ref 6.0–8.5)

## 2017-10-25 LAB — LIPID PANEL
Chol/HDL Ratio: 2.8 ratio (ref 0.0–5.0)
Cholesterol, Total: 155 mg/dL (ref 100–199)
HDL: 55 mg/dL (ref >39)
LDL Calculated: 86 mg/dL (ref 0–99)
Triglycerides: 69 mg/dL (ref 0–149)
VLDL Cholesterol Cal: 14 mg/dL (ref 5–40)

## 2017-10-25 LAB — CBC WITH DIFFERENTIAL/PLATELET
Basophils Absolute: 0 10*3/uL (ref 0.0–0.2)
Basos: 0 %
EOS (ABSOLUTE): 0.2 10*3/uL (ref 0.0–0.4)
Eos: 3 %
Hematocrit: 38.6 % (ref 37.5–51.0)
Hemoglobin: 13.3 g/dL (ref 13.0–17.7)
Immature Grans (Abs): 0 10*3/uL (ref 0.0–0.1)
Immature Granulocytes: 0 %
Lymphocytes Absolute: 1.1 10*3/uL (ref 0.7–3.1)
Lymphs: 17 %
MCH: 26.7 pg (ref 26.6–33.0)
MCHC: 34.5 g/dL (ref 31.5–35.7)
MCV: 77 fL — ABNORMAL LOW (ref 79–97)
Monocytes Absolute: 0.6 10*3/uL (ref 0.1–0.9)
Monocytes: 10 %
Neutrophils Absolute: 4.3 10*3/uL (ref 1.4–7.0)
Neutrophils: 70 %
Platelets: 233 10*3/uL (ref 150–379)
RBC: 4.99 x10E6/uL (ref 4.14–5.80)
RDW: 14 % (ref 12.3–15.4)
WBC: 6.2 10*3/uL (ref 3.4–10.8)

## 2017-10-25 LAB — FERRITIN: Ferritin: 63 ng/mL (ref 30–400)

## 2017-10-26 NOTE — Progress Notes (Signed)
Internal Medicine Clinic Attending  Case discussed with Dr. Boswell at the time of the visit.  We reviewed the resident's history and exam and pertinent patient test results.  I agree with the assessment, diagnosis, and plan of care documented in the resident's note.  

## 2017-11-15 ENCOUNTER — Other Ambulatory Visit: Payer: Self-pay

## 2017-11-15 ENCOUNTER — Telehealth: Payer: Self-pay | Admitting: Rehabilitation

## 2017-11-15 ENCOUNTER — Encounter: Payer: Self-pay | Admitting: Rehabilitation

## 2017-11-15 ENCOUNTER — Ambulatory Visit: Payer: Medicare Other | Admitting: Rehabilitation

## 2017-11-15 ENCOUNTER — Ambulatory Visit: Payer: Medicare Other | Attending: Internal Medicine | Admitting: Rehabilitation

## 2017-11-15 DIAGNOSIS — G801 Spastic diplegic cerebral palsy: Secondary | ICD-10-CM

## 2017-11-15 DIAGNOSIS — R2689 Other abnormalities of gait and mobility: Secondary | ICD-10-CM | POA: Diagnosis not present

## 2017-11-15 DIAGNOSIS — R29818 Other symptoms and signs involving the nervous system: Secondary | ICD-10-CM | POA: Insufficient documentation

## 2017-11-15 DIAGNOSIS — R293 Abnormal posture: Secondary | ICD-10-CM | POA: Insufficient documentation

## 2017-11-15 NOTE — Telephone Encounter (Signed)
Dr. Mikey Bussing,   I am seeing [name] at [facility] for PT.  He is in need of a new custom w/c as he has not had one in many many years.  Please write separate order in Epic for w/c evaluation and we can get that scheduled here at this location.  Also he/caregiver are in need of a case Research officer, political party.  Is this something that you all can refer for?  Thanks,  Harriet Butte, PT, MPT [facility] [address] [place], [place], [address] Phone: [phone]   Fax:  [phone] 11/15/17, 12:16 PM

## 2017-11-15 NOTE — Telephone Encounter (Signed)
Irving Burton, I would be happy to place those orders, what is the Epic order for the PT wheel chair evaluation (I see PT wheelchair modifications- is that the correct order), I will also try to place a referral to social work- he may be eligeable for [facility].    Chilon, would [facility] referral be indicated?

## 2017-11-15 NOTE — Therapy (Signed)
[facility] [address] [place], [place], [address] Phone: [phone]   Fax:  [phone]  Physical Therapy Evaluation  Patient Details  Name: Austin [name] MRN: [id number] Date of Birth: 05/04/1976 Referring Provider: Valentino Nose, MD   Encounter Date: 11/15/2017  PT End of Session - 11/15/17 1149    Visit Number  1    Number of Visits  5    Date for PT Re-Evaluation  12/15/17    Authorization Type  Medicare    PT Start Time  1105    PT Stop Time  1140    PT Time Calculation (min)  35 min    Activity Tolerance  Other (comment) limited due to behavioral status    Behavior During Therapy  Restless       Past Medical History:  Diagnosis Date  . Cerebral palsy (HCC)   . Nonspecific elevation of levels of transaminase and lactic acid dehydrogenase (LDH)   . Seizure disorder [facility])     Past Surgical History:  Procedure Laterality Date  . I&D EXTREMITY  09/16/2011   Procedure: IRRIGATION AND DEBRIDEMENT EXTREMITY;  Surgeon: Tami Ribas, MD;  Location: [facility] OR;  Service: Orthopedics;  Laterality: Left;  Left  . I&D EXTREMITY  09/17/2011   Procedure: IRRIGATION AND DEBRIDEMENT EXTREMITY;  Surgeon: Tami Ribas, MD;  Location: [facility] OR;  Service: Orthopedics;  Laterality: Left;  Irrigation and debridement of left hand   . INTRAMEDULLARY (IM) NAIL INTERTROCHANTERIC Right 06/29/2014   Procedure: INTRAMEDULLARY (IM) NAIL INTERTROCHANTRIC;  Surgeon: Cheral Almas, MD;  Location: [facility] OR;  Service: Orthopedics;  Laterality: Right;    There were no vitals filed for this visit.   Subjective Assessment - 11/15/17 1106    Subjective  Pt here with sister who is legal guardian.  She reports pt is w/c level at baseline.  Does get into floor and crawls around by himself.   Gets into bed by himself.  She has difficulty getting him into/out of car/van.     Patient is accompained by:  Family member [name], sister    Pertinent  History  Hx of CP    Limitations  House hold activities;Walking    Patient Stated Goals  "to get him stretched out a little, get him in/out of car easier."    Currently in Pain?  No/denies Pts sister reports she does not note any pain         [facility] PT Assessment - 11/15/17 0001      Assessment   Medical Diagnosis  CP    Referring Provider  Valentino Nose, MD    Onset Date/Surgical Date  -- has always had difficulty with transfers    Prior Therapy  Has not had therapy in many years.       Precautions   Precautions  Fall    Precaution Comments  seisure disorder       Balance Screen   Has the patient fallen in the past 6 months  No      Home Environment   Living Environment  Private residence    Living Arrangements  Other relatives Sister    Available Help at Discharge  Family;Available 24 hours/day    Type of Home  House    Home Access  Ramped entrance    Home Layout  One level    Home Equipment  Wheelchair - manual does bed baths    Additional Comments  Does have walk in shower  Prior Function   Level of Independence  Needs assistance with transfers;Needs assistance with ADLs    Vocation  On disability      Sensation   Light Touch  -- unable to formally test, pt nonverbal      Coordination   Gross Motor Movements are Fluid and Coordinated  No    Fine Motor Movements are Fluid and Coordinated  No      Posture/Postural Control   Posture Comments  Pt presents with R spastic hemiplegia (CP).  ROM very limited in RUE/LE, however pt has good functional strength and ROM in LUE/LE.  Is able to stand and transfer on LLE.        ROM / Strength   AROM / PROM / Strength  -- see above      Transfers   Transfers  Sit to Stand;Stand to Sit;Stand Pivot Transfers    Sit to Stand  4: Min assist    Sit to Stand Details  Tactile cues for initiation    Sit to Stand Details (indicate cue type and reason)  Pt reaches for PT to stand during transfer.      Stand to Sit  4: Min assist     Stand to Sit Details (indicate cue type and reason)  Tactile cues for initiation    Stand Pivot Transfers  4: Min assist    Stand Pivot Transfer Details (indicate cue type and reason)  Pt able to pivot on L LE to turn and sit on chair.     Comments  Pts sister reports difficulty getting pt into/out of car.  Note that she has newphew demonstrate how they do transfer today during session.  He does not remove leg rest, instead lifts pt up and places him in car.        Ambulation/Gait   Ambulation/Gait  -- pt is non-ambulatory, crawls at home                Objective measurements completed on examination: See above findings.              PT Education - 11/15/17 1148    Education provided  Yes    Education Details  Education on two main goals, getting w/c evaluation set up, finding out about rolling shower chair.     Person(s) Educated  Agricultural engineer);Patient    Methods  Explanation    Comprehension  Verbalized understanding          PT Long Term Goals - 11/15/17 1210      PT LONG TERM GOAL #1   Title  Pts caregiver will be independent with final HEP for improved flexibility/ROM.  (Target Date: 12/15/17)    Time  4    Period  Weeks    Status  New    Target Date  12/15/17      PT LONG TERM GOAL #2   Title  Pts caregiver will demonstrate improved safety with car transfers in order to decrease injury to caregiver and patient.      Time  4    Period  Weeks    Status  New      PT LONG TERM GOAL #3   Title  Pt will get w/c evaluation scheduled in order to better accomodate seating needs.      Time  4    Period  Weeks    Status  New             Plan - 11/15/17 1151  Clinical Impression Statement  Pt presents with diagnosis of cerebral palsy with R spastic hemiparesis and seizure disorder.  Also note that he had surgery on R femur many years ago and is unable to bear much weight on RLE.  Sister [name] present with pt today and reports that MD recommended  PT.  She also states that they have marked difficulty getting in/out of car and that they are not doing any formal HEP.  She does stretch his legs some when they are giving him bath in bed.  Also note that PT had them demonstrate car transfer at end of session.  Sister's nephew lifts pt and puts him in car.  This is not safe for either pt nor family member.  Discussed that PT will pick up pt for short POC to ensure safe HEP for stretching as well as safety with car transfers.  PT also going to request w/c evaluation order as he does not have appropriate chair for his deficits.  Pts sister verbalized understanding.     Clinical Presentation  Stable    Clinical Decision Making  Low    Rehab Potential  Fair    Clinical Impairments Affecting Rehab Potential  history of CP    PT Frequency  1x / week    PT Duration  4 weeks    PT Treatment/Interventions  ADLs/Self Care Home Management;Therapeutic activities;Functional mobility training;Passive range of motion    PT Next Visit Plan  provide formal HEP stretching program/ROM, see if got order for W/c eval, they need to get set up with case manager in order to get Medicaid for shower chair, car transfers    Consulted and Agree with Plan of Care  Patient;Family member/caregiver    Family Member Consulted  Sister, [name]       Patient will benefit from skilled therapeutic intervention in order to improve the following deficits and impairments:  Decreased mobility, Decreased range of motion, Postural dysfunction, Impaired UE functional use, Impaired tone  Visit Diagnosis: Other abnormalities of gait and mobility  Other symptoms and signs involving the nervous system  Abnormal posture     Problem List Patient Active Problem List   Diagnosis Date Noted  . Single episode of elevated blood pressure 10/04/2016  . Cerebral palsy (HCC)   . Preventative health care 03/13/2013  . Seizure disorder (HCC) 10/24/2006    Harriet Butte, PT, MPT [facility] [address] [place], [place], [address] Phone: [phone]   Fax:  [phone] 11/15/17, 12:13 PM  Name: Delynn Pursley MRN: [id number] Date of Birth: 23-May-1976

## 2017-11-16 NOTE — Telephone Encounter (Signed)
I will check with [facility]ides to make sure we will have a Supplier for the patient and this is the correct procedure to follow even though she is requesting the Chair.  I will also check with the patient to see where he got his last one from to see if he would like to use [facility]) since they are local.  Patients typically need an office visit to accompany getting approved for a WC.  Yes if you believe he could benefit from the [facility] and he agrees go ahead an place a referral.

## 2017-11-30 ENCOUNTER — Other Ambulatory Visit: Payer: Self-pay | Admitting: Internal Medicine

## 2017-11-30 ENCOUNTER — Telehealth: Payer: Self-pay | Admitting: Rehabilitation

## 2017-11-30 ENCOUNTER — Ambulatory Visit: Payer: Medicare Other | Attending: Internal Medicine | Admitting: Rehabilitation

## 2017-11-30 ENCOUNTER — Encounter: Payer: Self-pay | Admitting: Rehabilitation

## 2017-11-30 DIAGNOSIS — R293 Abnormal posture: Secondary | ICD-10-CM | POA: Diagnosis not present

## 2017-11-30 DIAGNOSIS — R2689 Other abnormalities of gait and mobility: Secondary | ICD-10-CM

## 2017-11-30 DIAGNOSIS — R29818 Other symptoms and signs involving the nervous system: Secondary | ICD-10-CM | POA: Diagnosis not present

## 2017-11-30 DIAGNOSIS — G801 Spastic diplegic cerebral palsy: Secondary | ICD-10-CM | POA: Insufficient documentation

## 2017-11-30 NOTE — Therapy (Signed)
[facility] [address] [place], [place], [address] Phone: [phone]   Fax:  [phone]  Physical Therapy Treatment  Patient Details  Name: Austin [name] MRN: [id number] Date of Birth: Feb 19, 1976 Referring Provider: Valentino Nose, MD   Encounter Date: 11/30/2017  PT End of Session - 11/30/17 0944    Visit Number  2    Number of Visits  5    Date for PT Re-Evaluation  12/15/17    Authorization Type  Medicare    PT Start Time  0845 pt unwilling to perform another car transfer    PT Stop Time  0920    PT Time Calculation (min)  35 min    Activity Tolerance  Other (comment) limited due to behavioral status    Behavior During Therapy  Restless       Past Medical History:  Diagnosis Date  . Cerebral palsy (HCC)   . Nonspecific elevation of levels of transaminase and lactic acid dehydrogenase (LDH)   . Seizure disorder [facility])     Past Surgical History:  Procedure Laterality Date  . I&D EXTREMITY  09/16/2011   Procedure: IRRIGATION AND DEBRIDEMENT EXTREMITY;  Surgeon: Tami Ribas, MD;  Location: [facility] OR;  Service: Orthopedics;  Laterality: Left;  Left  . I&D EXTREMITY  09/17/2011   Procedure: IRRIGATION AND DEBRIDEMENT EXTREMITY;  Surgeon: Tami Ribas, MD;  Location: [facility] OR;  Service: Orthopedics;  Laterality: Left;  Irrigation and debridement of left hand   . INTRAMEDULLARY (IM) NAIL INTERTROCHANTERIC Right 06/29/2014   Procedure: INTRAMEDULLARY (IM) NAIL INTERTROCHANTRIC;  Surgeon: Cheral Almas, MD;  [facility];  Service: Orthopedics;  Laterality: Right;    There were no vitals filed for this visit.  Subjective Assessment - 11/30/17 0937    Subjective  Pts sister reports no changes since last visit.  No one has contacted them regarding w/c evaluation nor social work consult.      Patient is accompained by:  Family member    Limitations  House hold activities;Walking    Patient Stated Goals  "to get  him stretched out a little, get him in/out of car easier."    Currently in Pain?  No/denies pt with no s/s of pain during session                       [facility] Adult PT Treatment/Exercise - 11/30/17 0001      Transfers   Transfers  Sit to Stand;Stand to Sit;Stand Pivot Transfers    Sit to Stand  4: Min assist    Sit to Stand Details  Tactile cues for initiation    Sit to Stand Details (indicate cue type and reason)  Pt instinctively reaches for therapist to assist with stand and transfer.     Stand to Sit  4: Min assist    Stand to Sit Details (indicate cue type and reason)  Tactile cues for initiation    Stand Pivot Transfers  4: Min assist    Stand Pivot Transfer Details (indicate cue type and reason)  Pt able to pivot on LLE only but needs guiding assist for proper placement into chair.     Comments  Also worked on car transfer at end of session today.  Attempted to have pt hop on LLE to get closer to car as well as reach for grab bar in car.  Pt needs total A to hop closer to car seat and needs total A  for placement of LLE to inside of car but he did then push to scoot back into seat.        Self-Care   Self-Care  Other Self-Care Comments    Other Self-Care Comments   Discussed that PT would like to try to get pt into car with use of step stool.  Feel that he may be able to crawl into car vs pivot into car better if elevated closer to car seat height.  Pts sister to bring at next session in order to try.  Also educated pts sister to call case manager that they had at hospital to see if they can provide any education on futher equipment needs for home that Medicare may cover.  Also educated for her to follow up with Dr. Mikey Bussing but that PT would also send another note to MD regarding need for w/c evaluation and need for follow up on social work.        Therapeutic Activites    Therapeutic Activities  Other Therapeutic Activities    Other Therapeutic Activities  Attempted to go  over simple LE ROM HEP during session.  Discussed that this would be great to perform with him while in bed to maintain joint ROM and flexibility, esp on RLE.  Provided handout with pictures and instructions.  Pt did allow a few motions such as knee to chest and did allow PT to perform R ankle ROM during session, however demo'd resistance against remaining ROM.  PT provided demonstration and education on how to perform along with recommendations of when to stand vs when to sit by bedside in order to maintain body mechanics.  Pts sister verbalized understanding.          [email or web address]://[email or web address] (This is not exact website of handout provided during session, but this is all motions discussed during session and attempted to perform)     PT Education - 11/30/17 0944    Education provided  Yes    Education Details  see self care     Person(s) Educated  Patient;Caregiver(s)    Methods  Explanation;Demonstration;Handout    Comprehension  Verbalized understanding;Returned demonstration;Verbal cues required;Tactile cues required;Need further instruction          PT Long Term Goals - 11/15/17 1210      PT LONG TERM GOAL #1   Title  Pts caregiver will be independent with final HEP for improved flexibility/ROM.  (Target Date: 12/15/17)    Time  4    Period  Weeks    Status  New    Target Date  12/15/17      PT LONG TERM GOAL #2   Title  Pts caregiver will demonstrate improved safety with car transfers in order to decrease injury to caregiver and patient.      Time  4    Period  Weeks    Status  New      PT LONG TERM GOAL #3   Title  Pt will get w/c evaluation scheduled in order to better accomodate seating needs.      Time  4    Period  Weeks    Status  New            Plan - 11/30/17 0945    Clinical Impression Statement  Skilled session focused on education and some demonstration on ROM HEP for home, esp for RLE.  Also continue to educate on  follow up of w/c evaluation and social work consult along with addressing car  transfer from w/c.  Pts sister to bring step stool next visit to try transfer in this manner.      Rehab Potential  Fair    Clinical Impairments Affecting Rehab Potential  history of CP    PT Frequency  1x / week    PT Duration  4 weeks    PT Treatment/Interventions  ADLs/Self Care Home Management;Therapeutic activities;Functional mobility training;Passive range of motion    PT Next Visit Plan  follow up on formal HEP stretching program/ROM, see if got order for W/c eval, they need to get set up with case manager in order to get Medicaid for shower chair, car transfers    Consulted and Agree with Plan of Care  Patient;Family member/caregiver    Family Member Consulted  Sister, Okey RegalCarol       Patient will benefit from skilled therapeutic intervention in order to improve the following deficits and impairments:  Decreased mobility, Decreased range of motion, Postural dysfunction, Impaired UE functional use, Impaired tone  Visit Diagnosis: Spastic diplegic cerebral palsy (HCC)  Other abnormalities of gait and mobility  Other symptoms and signs involving the nervous system  Abnormal posture     Problem List Patient Active Problem List   Diagnosis Date Noted  . Single episode of elevated blood pressure 10/04/2016  . Cerebral palsy (HCC)   . Preventative health care 03/13/2013  . Seizure disorder (HCC) 10/24/2006    Harriet ButteEmily Linna Thebeau, PT, MPT [facility] [address] [place], [place], [address] Phone: [phone]   Fax:  [phone] 11/30/17, 9:50 AM  Name: Talbot GrumblingReuben Motl MRN: [id number] Date of Birth: 1975-12-18

## 2017-11-30 NOTE — Progress Notes (Signed)
[facility].  PT wheelchair evaluation order placed.

## 2017-11-30 NOTE — Telephone Encounter (Signed)
Dr. Mikey BussingHoffman,   [name].  As previously mentioned, he would greatly benefit from new w/c.  I saw the telephone encounter from your office and have spoken to other therapist here who deal with w/c evaluations.  I do not believe he needs a face to face visit with you for a manual w/c.  If it turns out that he does, once the paperwork is filled from the w/c evaluation done here, they will let him know that they need to set up an appt with you.  If you wouldn't mind, please write order for w/c evaluation here at [facility] and I can get that set up for them.  Also, I see that he was referred to social work, however they report today that they have not been contacted.  They seem to need a good amount of help figuring out benefits and options for his care (day programs, equipment needs, etc) and would greatly benefit from social work consult.  Please follow up with them if possible regarding this manner.    Thanks,  Austin [name], PT, MPT [facility] [address] [place], [place], [address] Phone: [phone]   Fax:  [phone] 11/30/17, 9:37 AM

## 2017-12-07 ENCOUNTER — Ambulatory Visit: Payer: Medicare Other | Admitting: Rehabilitation

## 2017-12-07 DIAGNOSIS — G801 Spastic diplegic cerebral palsy: Secondary | ICD-10-CM

## 2017-12-07 DIAGNOSIS — R29818 Other symptoms and signs involving the nervous system: Secondary | ICD-10-CM

## 2017-12-07 DIAGNOSIS — R2689 Other abnormalities of gait and mobility: Secondary | ICD-10-CM

## 2017-12-07 DIAGNOSIS — R293 Abnormal posture: Secondary | ICD-10-CM | POA: Diagnosis not present

## 2017-12-08 ENCOUNTER — Encounter: Payer: Self-pay | Admitting: Rehabilitation

## 2017-12-08 NOTE — Therapy (Signed)
[facility] [address] [place], [place], [address] Phone: [phone]   Fax:  [phone]  Physical Therapy Treatment  Patient Details  Name: Austin [name] MRN: [id number] Date of Birth: 01-18-1976 Referring Provider: Valentino NoseNathan Boswell, MD   Encounter Date: 12/07/2017  PT End of Session - 12/08/17 0726    Visit Number  3    Number of Visits  5    Date for PT Re-Evaluation  12/15/17    Authorization Type  Medicare    PT Start Time  1500 Pt arrived late, needing to leave early to take sister to work    PT Stop Time  1528    PT Time Calculation (min)  28 min    Activity Tolerance  Other (comment) limited due to behavioral status    Behavior During Therapy  Restless       Past Medical History:  Diagnosis Date  . Cerebral palsy (HCC)   . Nonspecific elevation of levels of transaminase and lactic acid dehydrogenase (LDH)   . Seizure disorder [facility](HCC)     Past Surgical History:  Procedure Laterality Date  . I&D EXTREMITY  09/16/2011   Procedure: IRRIGATION AND DEBRIDEMENT EXTREMITY;  Surgeon: Tami RibasKevin R Kuzma, MD;  [facility];  Service: Orthopedics;  Laterality: Left;  Left  . I&D EXTREMITY  09/17/2011   Procedure: IRRIGATION AND DEBRIDEMENT EXTREMITY;  Surgeon: Tami RibasKevin R Kuzma, MD;  [facility];  Service: Orthopedics;  Laterality: Left;  Irrigation and debridement of left hand   . INTRAMEDULLARY (IM) NAIL INTERTROCHANTERIC Right 06/29/2014   Procedure: INTRAMEDULLARY (IM) NAIL INTERTROCHANTRIC;  Surgeon: Cheral AlmasNaiping Michael Xu, MD;  Location: [facility] OR;  Service: Orthopedics;  Laterality: Right;    There were no vitals filed for this visit.  Subjective Assessment - 12/08/17 0719    Subjective  Pt reports no changes since last visit.      Patient is accompained by:  Family member    Pertinent History  Hx of CP    Limitations  House hold activities;Walking    Patient Stated Goals  "to get him stretched out a little, get him  in/out of car easier."    Currently in Pain?  No/denies                       [facility] Adult PT Treatment/Exercise - 12/07/17 1500      Self-Care   Self-Care  Other Self-Care Comments    Other Self-Care Comments   PT provided information on contacting [facility] as he now as an order for this in [facility].  Discussed what [facility] assists with and provided with brochure and contact number.  Also provided education on w/c evaluation and what things are being considered during w/c evaluation.  Discussed that this session is slightly longer in order to see Austin [name] move and discuss what chair will best meet both of their needs.  Pts sister verbalized understanding.       Therapeutic Activites    Therapeutic Activities  Other Therapeutic Activities    Other Therapeutic Activities  Performed w/c<>car transfer several times during session (x 6 reps) with PT performing x 4 reps using 6" step that sister brought to session today for LLE placement and leverage into car.  PT placed pts w/c facing car seat and placed LLE onto 6" step.  Pts sister present during session with bag of snacks for pt.  Note that he is very interested in bags/snacks so this helped to  motivate him to get into car.  PT able to stabilize LLE on step and guide pt into car so that he crawled into car.  Only requires min A in this manner.  Had pts sister perform and note that she was unable to fully stabilize LLE on step as PT had done, but she was able to get him to crawl into car as PT did and she provided less assist than she normally does, therefore recommend them continuing to practice in this manner at home.                PT Education - 12/08/17 0725    Education provided  Yes    Education Details  See self care and TA section     Person(s) Educated  Patient;Caregiver(s)    Methods  Explanation;Demonstration    Comprehension  Verbalized understanding;Returned demonstration          PT Long Term Goals - 11/15/17 1210       PT LONG TERM GOAL #1   Title  Pts caregiver will be independent with final HEP for improved flexibility/ROM.  (Target Date: 12/15/17)    Time  4    Period  Weeks    Status  New    Target Date  12/15/17      PT LONG TERM GOAL #2   Title  Pts caregiver will demonstrate improved safety with car transfers in order to decrease injury to caregiver and patient.      Time  4    Period  Weeks    Status  New      PT LONG TERM GOAL #3   Title  Pt will get w/c evaluation scheduled in order to better accomodate seating needs.      Time  4    Period  Weeks    Status  New            Plan - 12/08/17 0727    Clinical Impression Statement  Skilled session focused on car transfers with use of 6" step.  PT able to stabilize LLE on step during transfer and pt only needing min A to complete task.  Sister will need some practice, but she was able to do during session.  W/c evaluation is tenatively scheduled.      Rehab Potential  Fair    Clinical Impairments Affecting Rehab Potential  history of CP    PT Frequency  1x / week    PT Duration  4 weeks    PT Treatment/Interventions  ADLs/Self Care Home Management;Therapeutic activities;Functional mobility training;Passive range of motion    PT Next Visit Plan  follow up on formal HEP stretching program/ROM, follow up on w/c eval, car transfers    Consulted and Agree with Plan of Care  Patient;Family member/caregiver    Family Member Consulted  Sister, Austin [name]       Patient will benefit from skilled therapeutic intervention in order to improve the following deficits and impairments:  Decreased mobility, Decreased range of motion, Postural dysfunction, Impaired UE functional use, Impaired tone  Visit Diagnosis: Spastic diplegic cerebral palsy (HCC)  Other abnormalities of gait and mobility  Other symptoms and signs involving the nervous system  Abnormal posture     Problem List Patient Active Problem List   Diagnosis Date Noted  . Single  episode of elevated blood pressure 10/04/2016  . Cerebral palsy (HCC)   . Preventative health care 03/13/2013  . Seizure disorder (HCC) 10/24/2006    Austin [name]  Marya Amsler, PT, MPT [facility] [address] [place], [place], [address] Phone: [phone]   Fax:  [phone] 12/08/17, 7:30 AM  Name: Austin [name] MRN: [id number] Date of Birth: [birth year]

## 2017-12-12 ENCOUNTER — Other Ambulatory Visit: Payer: Self-pay

## 2017-12-12 NOTE — Patient Outreach (Signed)
[facility] Care Management  12/12/2017  Austin GrumblingReuben [name] [birth year] [id number]   TELEPHONE SCREENING Referral date: 12/12/17 Referral source: primary MD  Referral reason: consult for medication management, coordination of care Insurance: Medicare Attempt #1  Telephone call to patient regarding primary MD referral. Contact states she is patients sister and power of attorney. Informed contact I would need to speak with patient for verbal authorization to speak with her due to HIPAA regulations. HIPAA compliant message left with call back phone number.   PLAN: RNCM will attempt 2nd telephone call within 4 business days.  RNCM will send outreach letter to patient.   George InaDavina Daiquan Resnik RN,BSN,CCM [facility] Telephonic  [phone]

## 2017-12-13 ENCOUNTER — Encounter: Payer: Self-pay | Admitting: Rehabilitation

## 2017-12-13 ENCOUNTER — Ambulatory Visit: Payer: Medicare Other | Admitting: Rehabilitation

## 2017-12-13 DIAGNOSIS — G801 Spastic diplegic cerebral palsy: Secondary | ICD-10-CM

## 2017-12-13 DIAGNOSIS — R293 Abnormal posture: Secondary | ICD-10-CM

## 2017-12-13 DIAGNOSIS — R29818 Other symptoms and signs involving the nervous system: Secondary | ICD-10-CM | POA: Diagnosis not present

## 2017-12-13 DIAGNOSIS — R2689 Other abnormalities of gait and mobility: Secondary | ICD-10-CM | POA: Diagnosis not present

## 2017-12-13 NOTE — Therapy (Signed)
[place] [address] [place], [place], [address] Phone: [phone]   Fax:  [phone]  Physical Therapy Treatment  Patient Details  Name: Austin [name] MRN: [id number] Date of Birth: 02-15-1976 Referring Provider: Maryellen Pile, MD   Encounter Date: 12/13/2017  PT End of Session - 12/13/17 2005    Visit Number  4    Number of Visits  5    Date for PT Re-Evaluation  12/15/17    Authorization Type  Medicare    PT Start Time  1407 did not need whole time to wrap up visit    PT Stop Time  1435    PT Time Calculation (min)  28 min    Activity Tolerance  Other (comment) limited due to behavioral status    Behavior During Therapy  Restless       Past Medical History:  Diagnosis Date  . Cerebral palsy ([place])   . Nonspecific elevation of levels of transaminase and lactic acid dehydrogenase (LDH)   . Seizure disorder [facility])     Past Surgical History:  Procedure Laterality Date  . I&D EXTREMITY  09/16/2011   Procedure: IRRIGATION AND DEBRIDEMENT EXTREMITY;  Surgeon: Tennis Must, MD;  Location: [place];  Service: Orthopedics;  Laterality: Left;  Left  . I&D EXTREMITY  09/17/2011   Procedure: IRRIGATION AND DEBRIDEMENT EXTREMITY;  Surgeon: Tennis Must, MD;  Location: [place];  Service: Orthopedics;  Laterality: Left;  Irrigation and debridement of left hand   . INTRAMEDULLARY (IM) NAIL INTERTROCHANTERIC Right 06/29/2014   Procedure: INTRAMEDULLARY (IM) NAIL INTERTROCHANTRIC;  Surgeon: Marianna Payment, MD;  Location: [place];  Service: Orthopedics;  Laterality: Right;    There were no vitals filed for this visit.  Subjective Assessment - 12/13/17 1410    Subjective  No changes since last visit.  Sister reports no falls.     Patient is accompained by:  Family member    Pertinent History  Hx of CP    Limitations  House hold activities;Walking    Patient Stated Goals  "to get him stretched out a little, get him in/out  of car easier."    Currently in Pain?  No/denies                       [facility] Adult PT Treatment/Exercise - 12/13/17 0001      Self-Care   Self-Care  Other Self-Care Comments    Other Self-Care Comments   Pts sister reports that they are in a different car today, however they have been trying to do car transfer to SUV as practiced in previous session.  Sister again educated to continue to practice for improrved carryover using motivating item to get back to initiate movement.  Discussed that if she is planning to get different vehicle in the future, that PT would recommend car as she needs to plan for pt getting older as well as her getting older and preventing injury. Pts sister verbalized understanding.  PT confirmed w/c evaluation during session.  Note that they needed to change date/time due to conflict and following confirming with w/c evaluating PT, we were able to switch dates.  Discussed that [facility] had gotten in touch with them.  They are needing to confirm that sister is pts guardian, but continued to recommend they do this in order to get any info on community programs that pt/family can participate in.  Educated on progress towards goals.  Feel that pt  has met most goals.  Sister would like to keep last visit in case they wish to practice car transfers one more time with SuV.  PT to keep this visit for this purpose.               PT Education - 12/13/17 2004    Education provided  Yes    Education Details  see self care    Person(s) Educated  Patient;Caregiver(s)    Methods  Explanation    Comprehension  Verbalized understanding          PT Long Term Goals - 12/13/17 1420      PT LONG TERM GOAL #1   Title  Pts caregiver will be independent with final HEP for improved flexibility/ROM.  (Target Date: 12/15/17)    Baseline  Pt caregiver is able to do sporadically based on behavioral issues.     Time  4    Period  Weeks    Status  Partially Met      PT LONG  TERM GOAL #2   Title  Pts caregiver will demonstrate improved safety with car transfers in order to decrease injury to caregiver and patient.      Baseline  they have been able to get him to crawl in/out of SUV at min A level.     Time  4    Period  Weeks    Status  On-going      PT LONG TERM GOAL #3   Title  Pt will get w/c evaluation scheduled in order to better accomodate seating needs.      Baseline  scheduled for 7/9    Time  4    Period  Weeks    Status  Achieved            Plan - 12/13/17 2006    Clinical Impression Statement  Skilled session went over current goals being close to fully met, re-scheduling w/c evalution, following up with [facility] on Monday, and continuing to practice car transfer as we did in session for improved carryover.  Pts sister wanting to keep last PT visit to perform car transfers one more time before D/C.      Rehab Potential  Fair    Clinical Impairments Affecting Rehab Potential  history of CP    PT Frequency  1x / week    PT Duration  4 weeks    PT Treatment/Interventions  ADLs/Self Care Home Management;Therapeutic activities;Functional mobility training;Passive range of motion    PT Next Visit Plan  Car trasnfers, D/C    Consulted and Agree with Plan of Care  Patient;Family member/caregiver    Family Member Consulted  Sister, Arbie Cookey       Patient will benefit from skilled therapeutic intervention in order to improve the following deficits and impairments:  Decreased mobility, Decreased range of motion, Postural dysfunction, Impaired UE functional use, Impaired tone  Visit Diagnosis: Spastic diplegic cerebral palsy (HCC)  Other abnormalities of gait and mobility  Other symptoms and signs involving the nervous system  Abnormal posture     Problem List Patient Active Problem List   Diagnosis Date Noted  . Single episode of elevated blood pressure 10/04/2016  . Cerebral palsy ([place])   . Preventative health care 03/13/2013  . Seizure  disorder ([place]) 10/24/2006    Cameron Sprang, PT, MPT [facility] [address] [place], [place], [address] Phone: [phone]   Fax:  [phone] 12/13/17, 8:08 PM  Name:  Jernard Reiber MRN: [id number] Date of Birth: 1975/11/01

## 2017-12-17 ENCOUNTER — Other Ambulatory Visit: Payer: Self-pay

## 2017-12-17 NOTE — Patient Outreach (Signed)
[facility] Care Management  12/17/2017  Austin GrumblingReuben [name] 1976/01/26 [id number]  TELEPHONE SCREENING Referral date: 12/12/17 Referral source: primary MD  Referral reason: consult for medication management, coordination of care Insurance: Medicare Attempt #2  Second telephone call to patient regarding primary MD referral. Unable to reach patient. HIPAA compliant voice message left with call back phone number.   PLAN; RNCM will attempt 3rd telephone call to patient within 4 business days.   George InaDavina Cinque Begley RN,BSN,CCM [facility] Telephonic  [phone]

## 2017-12-18 ENCOUNTER — Ambulatory Visit: Payer: Medicare Other | Admitting: Physical Therapy

## 2017-12-21 ENCOUNTER — Other Ambulatory Visit: Payer: Self-pay

## 2017-12-21 ENCOUNTER — Ambulatory Visit: Payer: Medicare Other | Admitting: Rehabilitation

## 2017-12-21 NOTE — Patient Outreach (Signed)
[facility] Care Management  12/21/2017  Austin GrumblingReuben [name] January 17, 1976 [id number]   TELEPHONE SCREENING Referral date:12/12/17 Referral source:primary MD Referral reason:consult for medication management, coordination of care Insurance:Medicare Attempt #3  Third telephone call to patient regarding primary MD referral. Unable to reach patient. HIPAA compliant voice message left with call back phone number.   PLAN; If no return call will proceed with case closure.   Austin [name] Hyle RN,BSN,CCM [facility] Telephonic  [phone]

## 2017-12-23 ENCOUNTER — Encounter: Payer: Self-pay | Admitting: *Deleted

## 2017-12-25 ENCOUNTER — Other Ambulatory Visit: Payer: Self-pay | Admitting: Internal Medicine

## 2017-12-25 ENCOUNTER — Other Ambulatory Visit: Payer: Self-pay

## 2017-12-25 DIAGNOSIS — Z0000 Encounter for general adult medical examination without abnormal findings: Secondary | ICD-10-CM

## 2017-12-25 NOTE — Progress Notes (Signed)
Consulted by [facility] about case closure as they are unable to reach patient.  They recommended case management follow-up.  -Referred patient for social work due to possible case management needs.  Lorenso CourierVahini Kail Fraley, MD Internal Medicine PGY2 Pager:[phone] 12/25/2017, 10:09 AM

## 2017-12-25 NOTE — Patient Outreach (Signed)
[facility] Care Management  12/25/2017  Talbot GrumblingReuben Ravi 12-04-1975 [id number]  TELEPHONE SCREENING Referral date:12/12/17 Referral source:primary MD Referral reason:consult for medication management, coordination of care Insurance:Medicare  No response from patient after 3 telephone calls and outreach letter attempt.  PLAN;  RNCM will close patient due to being unable to reach.  RNCM will send closure notification to patient's primary care provider  George InaDavina Kareen Hitsman RN,BSN,CCM [facility] Telephonic  [phone]

## 2017-12-28 ENCOUNTER — Encounter: Payer: Self-pay | Admitting: Rehabilitation

## 2017-12-28 NOTE — Therapy (Signed)
[facility] [address] [place], [place], [address] Phone: [phone]   Fax:  [phone]  Patient Details  Name: Austin [name] MRN: [id number] Date of Birth: 04-Jan-1976 Referring Provider:  No ref. provider found  Encounter Date: 12/28/2017   PHYSICAL THERAPY DISCHARGE SUMMARY  Visits from Start of Care: 4  Current functional level related to goals / functional outcomes: PT Long Term Goals - 12/13/17 1420      PT LONG TERM GOAL #1   Title  Pts caregiver will be independent with final HEP for improved flexibility/ROM.  (Target Date: 12/15/17)    Baseline  Pt caregiver is able to do sporadically based on behavioral issues.     Time  4    Period  Weeks    Status  Partially Met      PT LONG TERM GOAL #2   Title  Pts caregiver will demonstrate improved safety with car transfers in order to decrease injury to caregiver and patient.      Baseline  they have been able to get him to crawl in/out of SUV at min A level.     Time  4    Period  Weeks    Status  On-going      PT LONG TERM GOAL #3   Title  Pt will get w/c evaluation scheduled in order to better accomodate seating needs.      Baseline  scheduled for 7/9    Time  4    Period  Weeks    Status  Achieved         Remaining deficits: Pt with spastic hemiplegia from CP diagnosis.  Pts family given HEP to perform as pt will allow.  Difficult to progress due to behavioral issues.   Pt to have w/c evaluation next week.    Education / Equipment: HEP  Plan: Patient agrees to discharge.  Patient goals were met. Patient is being discharged due to meeting the stated rehab goals.  ?????    Cameron Sprang, PT, MPT [facility] [address] [place], [place], [address] Phone: [phone]   Fax:  [phone] 12/28/17, 1:50 PM

## 2018-01-01 ENCOUNTER — Ambulatory Visit: Payer: Medicare Other | Attending: Internal Medicine | Admitting: Physical Therapy

## 2018-01-01 DIAGNOSIS — G801 Spastic diplegic cerebral palsy: Secondary | ICD-10-CM | POA: Diagnosis not present

## 2018-01-02 ENCOUNTER — Encounter: Payer: Self-pay | Admitting: Physical Therapy

## 2018-01-02 NOTE — Therapy (Signed)
[facility] [address] [place], [place], [address] Phone: [phone]   Fax:  [phone]  Physical Therapy Evaluation  Patient Details  Name: Austin [name] MRN: [id number] Date of Birth: [birth year] Referring Provider: Carlynn Purl, DO   Encounter Date: 01/01/2018  PT End of Session - 01/02/18 2017    Visit Number  1    Authorization Type  Medicare    PT Start Time  1015    PT Stop Time  1110    PT Time Calculation (min)  55 min       Past Medical History:  Diagnosis Date  . Cerebral palsy (HCC)   . Nonspecific elevation of levels of transaminase and lactic acid dehydrogenase (LDH)   . Seizure disorder [facility])     Past Surgical History:  Procedure Laterality Date  . I&D EXTREMITY  09/16/2011   Procedure: IRRIGATION AND DEBRIDEMENT EXTREMITY;  Surgeon: Tami Ribas, MD;  Location: [facility] OR;  Service: Orthopedics;  Laterality: Left;  Left  . I&D EXTREMITY  09/17/2011   Procedure: IRRIGATION AND DEBRIDEMENT EXTREMITY;  Surgeon: Tami Ribas, MD;  Location: [facility] OR;  Service: Orthopedics;  Laterality: Left;  Irrigation and debridement of left hand   . INTRAMEDULLARY (IM) NAIL INTERTROCHANTERIC Right 06/29/2014   Procedure: INTRAMEDULLARY (IM) NAIL INTERTROCHANTRIC;  Surgeon: Cheral Almas, MD;  Location: [facility] OR;  Service: Orthopedics;  Laterality: Right;    There were no vitals filed for this visit.   Subjective Assessment - 01/02/18 2016    Subjective  Pt presents for manual wheelchair evaluation - sister accompanying pt    Patient is accompained by:  Family member    Pertinent History  Hx of CP    Currently in Pain?  No/denies         [facility] PT Assessment - 01/02/18 0001      Assessment   Medical Diagnosis  CP    Referring Provider  Carlynn Purl, DO                Objective measurements completed on examination: See above findings.        LMN to be completed when specs received from  vendor, Saddie Benders, [employer] with [facility]                    Plan - 01/02/18 2019    Clinical Impression Statement  Pt seen for manual custom wheelchair eval; no vendor present due to miscommunication - family wishes to use [facility] for vendor    PT Frequency  One time visit    PT Treatment/Interventions  ADLs/Self Care Home Management;Therapeutic activities;Functional mobility training;Passive range of motion    Consulted and Agree with Plan of Care  Patient;Family member/caregiver    Family Member Consulted  Sister, [name]       Patient will benefit from skilled therapeutic intervention in order to improve the following deficits and impairments:     Visit Diagnosis: Spastic diplegic cerebral palsy (HCC) - Plan: PT plan of care cert/re-cert     Problem List Patient Active Problem List   Diagnosis Date Noted  . Single episode of elevated blood pressure 10/04/2016  . Cerebral palsy (HCC)   . Preventative health care 03/13/2013  . Seizure disorder (HCC) 10/24/2006    Numa Heatwole, Donavan Burnet, PT 01/02/2018, 8:24 PM  [facility] [address] [place], [place], [address] Phone: [phone]   Fax:  [phone]  Name: Austin [name] MRN:  [id number] Date of Birth: 1975/10/05

## 2018-01-17 NOTE — Telephone Encounter (Signed)
Patient has completed his PT eval on 01/01/2018 for his WC, and is sch on 02/15/2018 to see his PCP Dr. Delma Officerhundi for the face to face f/u.

## 2018-02-15 ENCOUNTER — Encounter: Payer: Self-pay | Admitting: Internal Medicine

## 2018-03-15 ENCOUNTER — Ambulatory Visit (INDEPENDENT_AMBULATORY_CARE_PROVIDER_SITE_OTHER): Payer: Medicare Other | Admitting: Internal Medicine

## 2018-03-15 VITALS — BP 127/97 | HR 82

## 2018-03-15 DIAGNOSIS — G801 Spastic diplegic cerebral palsy: Secondary | ICD-10-CM | POA: Diagnosis not present

## 2018-03-15 DIAGNOSIS — Z23 Encounter for immunization: Secondary | ICD-10-CM | POA: Diagnosis not present

## 2018-03-15 DIAGNOSIS — Z993 Dependence on wheelchair: Secondary | ICD-10-CM | POA: Diagnosis not present

## 2018-03-15 DIAGNOSIS — G40909 Epilepsy, unspecified, not intractable, without status epilepticus: Secondary | ICD-10-CM

## 2018-03-15 DIAGNOSIS — Z0000 Encounter for general adult medical examination without abnormal findings: Secondary | ICD-10-CM

## 2018-03-15 DIAGNOSIS — G809 Cerebral palsy, unspecified: Secondary | ICD-10-CM

## 2018-03-15 DIAGNOSIS — Z79899 Other long term (current) drug therapy: Secondary | ICD-10-CM | POA: Diagnosis not present

## 2018-03-15 DIAGNOSIS — R030 Elevated blood-pressure reading, without diagnosis of hypertension: Secondary | ICD-10-CM | POA: Diagnosis not present

## 2018-03-15 NOTE — Assessment & Plan Note (Signed)
The patient's sister mentioned that the patient's last seizure episode was 1 month ago. She notices that he usually has the seizure for a few minutes and he tightens up at that time. The patient is currently taking carbamazepine 200mg  bid.  Assessment and plan  The patient's seizure disorder is likely due to his cerebral palsy, however it is important to note whether the patient has any other seizure triggers. Will refer to neurology for further titration of the patient's seizure medication.

## 2018-03-15 NOTE — Patient Instructions (Addendum)
It was a pleasure to see you today [name]. During this visit I filled out your paperwork for your wheelchair. I placed a referral to neurology to help adjust your seizure medications.   If you have any questions or concerns, please call our clinic at [phone] between 9am-5pm and after hours call [phone] and ask for the internal medicine resident on call. If you feel you are having a medical emergency please call 911.   Thank you, we look forward to help you remain healthy!  Lorenso CourierVahini Zoee Heeney, MD Internal Medicine PGY2

## 2018-03-15 NOTE — Assessment & Plan Note (Signed)
The patient's blood pressure during this visit was 129/112 and repeat was 127/97. The patient is not currently on any blood pressure medication. His last blood pressure visits are   BP Readings from Last 3 Encounters:  03/15/18 (!) 127/97  10/24/17 (!) 157/96  10/04/16 (!) 146/86   Subjective symptoms of palpitations, dizziness, chest pain, sob are hard to elicit from patient due to his cerebral palsy.   Assessment and Plan The patient's blood pressure has been elevated for the past 3 visits. Unable to make significant lifestyle modifications due to the patient's poor functional status. Will continue to monitor the patient currently off of anti hypertensive agent.

## 2018-03-15 NOTE — Assessment & Plan Note (Signed)
The patient has spastic diplegic cerebral palsy since birth. He has been wheelchair bound for several years now. The patient can spontaneously move his upper and lower extremities, but cannot move or walk. He cannot perform many ADLs such as dressing or feeding himself. He is not able to interact with provider or follow commands.   Assessment and plan  The patient was seen for a mobility assessment for a ultralight manual wheelchair. I have reviewed and agree with the recent PT evaluation. The patient is in need of a manual wheelchair due to him being very limited in his movement and he needs assistance with movement inside and outside his house. He is not coordinated enough to walk on his own.

## 2018-03-15 NOTE — Progress Notes (Signed)
   CC: Wheelchair assessment  HPI:  [name] is a 42 y.o. with cerebral palsy and seizure disorder who presented for seizure disorder follow up. Please see problem based charting for evaluation, assessment, and plan.  Past Medical History:  Diagnosis Date  . Cerebral palsy (HCC)   . Nonspecific elevation of levels of transaminase and lactic acid dehydrogenase (LDH)   . Seizure disorder (HCC)    Review of Systems:    Patient not able to express how he is doing with provider  Physical Exam:  Vitals:   03/15/18 1503 03/15/18 1506  BP: (!) 129/112 (!) 127/97  Pulse: (!) 124 82   Physical Exam  Constitutional: He appears well-developed and well-nourished. No distress.  HENT:  Head: Normocephalic and atraumatic.  Eyes: Conjunctivae are normal.  Cardiovascular: Normal rate, regular rhythm and normal heart sounds.  Respiratory: Effort normal and breath sounds normal. No respiratory distress. He has no wheezes.  GI: Soft. Bowel sounds are normal. He exhibits no distension. There is no tenderness.  Musculoskeletal: He exhibits no edema.  Able to spontaneously move bilateral upper and lower extremities  Neurological: He is alert.  Skin: He is not diaphoretic.  Psychiatric: He has a normal mood and affect. His behavior is normal. Judgment and thought content normal.    Assessment & Plan:   See Encounters Tab for problem based charting.  Patient discussed with Dr. Heide SparkNarendra

## 2018-03-15 NOTE — Assessment & Plan Note (Signed)
Influenza vaccine administred

## 2018-03-18 NOTE — Progress Notes (Signed)
Internal Medicine Clinic Attending  Case discussed with Dr. Chundi at the time of the visit.  We reviewed the resident's history and exam and pertinent patient test results.  I agree with the assessment, diagnosis, and plan of care documented in the resident's note. 

## 2018-07-28 ENCOUNTER — Other Ambulatory Visit: Payer: Self-pay | Admitting: Internal Medicine

## 2018-07-28 DIAGNOSIS — G40909 Epilepsy, unspecified, not intractable, without status epilepticus: Secondary | ICD-10-CM

## 2018-07-31 ENCOUNTER — Other Ambulatory Visit: Payer: Self-pay | Admitting: Internal Medicine

## 2018-07-31 DIAGNOSIS — G40909 Epilepsy, unspecified, not intractable, without status epilepticus: Secondary | ICD-10-CM

## 2018-07-31 NOTE — Progress Notes (Signed)
Neurology referral 

## 2018-09-13 ENCOUNTER — Other Ambulatory Visit: Payer: Self-pay

## 2018-09-13 NOTE — Telephone Encounter (Signed)
Called pt's sister stated [facility] told her they have not received a refill on Tegretol which was refilled 07/31/18 #180 x 1 RF.  [facility] , [facility] to [facility] - stated they did not received the refill from Feb. VO given for Carbamazepine (Tegretol) 200 mg take 1 tab by mouth 2 times daily #180 x 1 RF per Dr Delma Officer.  Called pt 's sister back - informed rx will ready later today.

## 2018-09-13 NOTE — Telephone Encounter (Signed)
carbamazepine (TEGRETOL) 200 MG tablet   Refill request @  [id number] [place], [place] - [address] [phone] (Phone) [phone] (Fax)

## 2018-09-30 ENCOUNTER — Ambulatory Visit: Payer: Self-pay | Admitting: Diagnostic Neuroimaging

## 2018-10-24 ENCOUNTER — Telehealth: Payer: Self-pay | Admitting: *Deleted

## 2018-10-24 NOTE — Telephone Encounter (Signed)
LVM advising that due to current COVID 19 pandemic, our office is severely reducing in person visits in order to minimize the risk to our patients and healthcare providers. We recommend to convert your appointment to a video visit and can move appt sooner. Requested call back.

## 2018-11-07 NOTE — Telephone Encounter (Addendum)
Attempted to reach patient on home #, no mailbox, unable to LVM. Called mobile [phone]; no longer in service. Need to discuss converting to video.

## 2018-11-07 NOTE — Telephone Encounter (Signed)
done

## 2018-11-19 ENCOUNTER — Encounter: Payer: Self-pay | Admitting: *Deleted

## 2018-11-19 NOTE — Telephone Encounter (Signed)
Spoke with patient's sister, [name] who is his caregiver and advised her we need to do video visit or reschedule to August. She stated she prefers to reschedule. We rescheduled for August, and she  verbalized understanding, appreciation.

## 2018-11-26 ENCOUNTER — Ambulatory Visit: Payer: Self-pay | Admitting: Diagnostic Neuroimaging

## 2019-02-07 ENCOUNTER — Telehealth: Payer: Self-pay | Admitting: Internal Medicine

## 2019-02-07 ENCOUNTER — Ambulatory Visit: Payer: Self-pay | Admitting: Diagnostic Neuroimaging

## 2019-02-07 NOTE — Telephone Encounter (Signed)
I called his caregiver today to ask for specifically what needs to be said in the letter, but unfortunately she did not pick up phone. Can the reason why the patient needs the letter and what the contents need to be please be specified a bit more clearly

## 2019-02-07 NOTE — Telephone Encounter (Signed)
Pt is requesting letter saying he is disable [phone]

## 2019-02-10 ENCOUNTER — Telehealth: Payer: Self-pay | Admitting: Internal Medicine

## 2019-02-10 NOTE — Telephone Encounter (Signed)
Caregiver Called back.  Info to be faxed in reference to [employer] needing a letter of support with more details.

## 2019-02-20 ENCOUNTER — Telehealth: Payer: Self-pay

## 2019-02-20 NOTE — Telephone Encounter (Signed)
I returned phone call to Austin [name]  Sister to our patient Austin [name] requesting a Psychology Evaluation form to be completed by this office so he could continue [name]. I explained to the patients sister that this is an Internal Medicine office,we would not have that type of form in this office.I asked patient has he ever been to anyone before she did not no.The patient does have a neurologist that he has an upcoming appointment with,she said she would call them [place], [place] C8/27/20202:27 PM

## 2019-02-20 NOTE — Telephone Encounter (Signed)
Pt's sister requesting psychology evaluation form to be complete for patient, so he can continue to received home health services. Per patient sister, this form needs to come from our office. Please call back.

## 2019-02-24 ENCOUNTER — Telehealth: Payer: Self-pay | Admitting: Diagnostic Neuroimaging

## 2019-02-24 NOTE — Progress Notes (Deleted)
   CC: ***  HPI:  [name] is a 43 y.o. male with cerebral palsy and seizure disorder who presents for follow up. Please see problem based charting for evaluation, assessment, and plan.  Seizure disorder     Past Medical History:  Diagnosis Date  . Cerebral palsy ([place])   . Nonspecific elevation of levels of transaminase and lactic acid dehydrogenase (LDH)   . Seizure disorder ([place])    Review of Systems:    Review of Systems  Constitutional: Negative for chills and fever.  Respiratory: Negative for cough, sputum production and shortness of breath.   Cardiovascular: Negative for chest pain.  Gastrointestinal: Negative for abdominal pain, nausea and vomiting.  Neurological: Positive for seizures. Negative for dizziness, sensory change, weakness and headaches.   Physical Exam:  There were no vitals filed for this visit. ***  Assessment & Plan:   See Encounters Tab for problem based charting.  Patient {GC/GE:3044014::"discussed with","seen with"} Dr. {NAMES:[id number]::"Butcher","Granfortuna","E. Hoffman","Mullen","Narendra","Raines","Vincent"}

## 2019-02-24 NOTE — Telephone Encounter (Signed)
Called sister, Arbie Cookey who stated the patient will not wear a mask. She also doesn't want to bring him into the office due to his medical conditions. He has new patient appointment tomorrow. I advised she can sign up for my chart to do video visit. She stated she's not signed up, wanted to know if video visit can be done another way. I advised will check and call her back. She verbalized understanding, appreciation. I called Arbie Cookey back and advised her I can give her easy instructions to sign patient up for my chart. She asked me to e mail them to her at: [email or web address]. I will call her tomorrow to be sure she has gotten my chart active.  E mail was sent with my chart sign up web site and phone number.

## 2019-02-24 NOTE — Telephone Encounter (Signed)
Patient's sister stated she wants a call back from the nurse regarding tomorrow's apt as they cannot get him to wear a mask. Best call back is [phone]

## 2019-02-25 ENCOUNTER — Ambulatory Visit: Payer: Self-pay | Admitting: Diagnostic Neuroimaging

## 2019-02-25 ENCOUNTER — Other Ambulatory Visit: Payer: Self-pay

## 2019-02-25 ENCOUNTER — Ambulatory Visit (INDEPENDENT_AMBULATORY_CARE_PROVIDER_SITE_OTHER): Payer: Medicare Other | Admitting: Internal Medicine

## 2019-02-25 DIAGNOSIS — Z79899 Other long term (current) drug therapy: Secondary | ICD-10-CM | POA: Diagnosis not present

## 2019-02-25 DIAGNOSIS — G40909 Epilepsy, unspecified, not intractable, without status epilepticus: Secondary | ICD-10-CM | POA: Diagnosis not present

## 2019-02-25 DIAGNOSIS — G809 Cerebral palsy, unspecified: Secondary | ICD-10-CM

## 2019-02-25 MED ORDER — CARBAMAZEPINE 200 MG PO TABS: 200.0000 mg | ORAL_TABLET | Freq: Two times a day (BID) | ORAL | 0 refills | Status: DC

## 2019-02-25 NOTE — Telephone Encounter (Addendum)
Austin [name] who stated she was told I had to call my chart before she can sign patient up. I called [phone], was never connected to representative after > 5 minutes holding. Got through this number and reached [facility] internal med. The RN stated that she was unable to help Austin [name] sign patient up. I called Austin [name] who again stated that I needed to help. I sent her a my chart e mail with a new activation code. We rescheduled the my chart visit for next week. Austin [name] will get him signed up. She  verbalized understanding, appreciation.

## 2019-02-25 NOTE — Progress Notes (Signed)
Internal Medicine Clinic Attending  Case discussed with Dr. Chundi soon after the resident spoke with the patient.  We reviewed the resident's history and exam and pertinent patient test results.  I agree with the assessment, diagnosis, and plan of care documented in the resident's note. 

## 2019-02-25 NOTE — Telephone Encounter (Signed)
Pt sister has called to inform RN Stanton Kidney C that she tried calling the mychart help line and was told there is proxy information that is needed(that she does not have access to) Arbie Cookey was told that the there needs to be a call from RN Made to [employer] help desk.

## 2019-02-25 NOTE — Telephone Encounter (Signed)
Called Austin [name] who stated she registered yesterday, but she was unable to open my e mail with instructions. I advised her that the account needs to be in patient's name. I gave her my chart help #. She will call, and I'll touch base with her later. She  verbalized understanding, appreciation.

## 2019-02-25 NOTE — Progress Notes (Signed)
  [facility] Telephone Encounter Continuity Care Appointment  HPI:   This telephone encounter was created for [name] on 02/25/2019 to follow up regarding seizure disorder.    Past Medical History:  Past Medical History:  Diagnosis Date  . Cerebral palsy ([place])   . Nonspecific elevation of levels of transaminase and lactic acid dehydrogenase (LDH)   . Seizure disorder ([place])       ROS:  Constitutional: Negative for chills and fever.  Respiratory: Negative for cough, sputum production and shortness of breath.   Cardiovascular: Negative for chest pain.  Gastrointestinal: Negative for abdominal pain, nausea and vomiting.  Neurological: Positive for seizures. Negative for dizziness, sensory change, weakness and headaches.    Assessment / Plan / Recommendations:   Please see A&P under problem oriented charting for assessment of the patient's acute and chronic medical conditions.   As always, pt is advised that if symptoms worsen or new symptoms arise, they should go to an urgent care facility or to to ER for further evaluation.   Consent and Medical Decision Making:   Patient discussed with Dr. Philipp Ovens  This is a telephone encounter between Austin [name] and Austin [name] on 02/25/2019 for seizure disorder. The visit was conducted with the patient located at home and Austin [name] at [facility]. The patient's identity was confirmed using their DOB and current address. The his/her legal guardian has consented to being evaluated through a telephone encounter and understands the associated risks (an examination cannot be done and the patient may need to come in for an appointment) / benefits (allows the patient to remain at home, decreasing exposure to coronavirus). I personally spent 15 minutes on medical discussion.

## 2019-02-25 NOTE — Assessment & Plan Note (Addendum)
The patient is currently taking carbamazepine 200mg  bid. His last seizure disorder was one week ago and lasted a few seconds. She stated that the patient has been having 1-2 seizure episodes monthly. Patient is scheduled to have telehealth appt with neurology today.   Assessment and plan  Patient does not have any infectious symptoms, is keeping well hydrated. Attempting to check bmp and cbc to assess for electrolyte abnormalities and check renal function. Patient's sister does not want to bring patient into hospital or to go to [facility] as the patient is unable to keep a mask on. Our staff is attempting to coordinate home health RN to draw labs.    -CBC and bmp -will follow up with neurology recommendations -May consider diazepam for breakthrough seizures

## 2019-02-27 ENCOUNTER — Telehealth: Payer: Self-pay

## 2019-02-27 NOTE — Telephone Encounter (Signed)
I spoke to Austin [name] sister and caretaker for Austin [name].DR, Chundi did a Tele-health visit and wanted the patient to come in for blood work.The sister said that he  Would not keep a mask on and she really did not want him to come out unless it was necessary.I called home health [employer] and faxed the order and office notes [phone].I informed the sister that would call her first before they came.

## 2019-02-27 NOTE — Addendum Note (Signed)
Addended by: Lars Mage on: 02/27/2019 11:39 AM   Modules accepted: Orders

## 2019-03-04 ENCOUNTER — Telehealth: Payer: Self-pay | Admitting: Diagnostic Neuroimaging

## 2019-03-04 ENCOUNTER — Other Ambulatory Visit: Payer: Self-pay | Admitting: Internal Medicine

## 2019-03-04 DIAGNOSIS — G40909 Epilepsy, unspecified, not intractable, without status epilepticus: Secondary | ICD-10-CM

## 2019-03-04 NOTE — Telephone Encounter (Signed)
I called patient's sister Arbie Cookey to inform her dr is running behind due to incoming phone call he had to accept. She stated she wasn't able to get to my chart on her I pad due to pop up blockers. She was trying to get to it on her phone for the video visit. She now has to leave for work and has asked it to be rescheduled. She will call back to reschedule and will reschedule earlier in the day to accommodate her work schedule. She verbalized understanding, appreciation.

## 2019-03-04 NOTE — Addendum Note (Signed)
Addended by: Hulan Fray on: 03/04/2019 04:01 PM   Modules accepted: Orders

## 2019-03-05 ENCOUNTER — Encounter: Payer: Self-pay | Admitting: Diagnostic Neuroimaging

## 2019-03-05 NOTE — Telephone Encounter (Addendum)
[facility] [place] RN  [place], Dorette Grate, Trish Fountain, [place]; [place], Butch Penny; [place], Corene Cornea; Willaim Rayas, Orvis Brill, RN        We got it! Thanks so much! Let me know if there is anything else that we can help you with.   Previous Messages  ----- Message -----  From: Judge Stall, [place]  Sent: 03/04/2019  3:29 PM EDT  To: Janae Sauce, Jiles Crocker, Patsy [place], *  Subject: home health order                 The order is in for this patient, thank you for your help [place], [place] C9/8/20203:31 PM

## 2019-03-12 DIAGNOSIS — G40909 Epilepsy, unspecified, not intractable, without status epilepticus: Secondary | ICD-10-CM | POA: Diagnosis not present

## 2019-03-13 NOTE — Telephone Encounter (Signed)
[place], [place], Orvis Brill, RN; [place]; [place], Trish Fountain, [place]; Jiles Crocker; Patsy Baltimore; 1 other        Good Afternoon! Yes, the [facility] for this patient was 03/08/19.

## 2019-03-18 ENCOUNTER — Telehealth: Payer: Self-pay | Admitting: *Deleted

## 2019-03-18 DIAGNOSIS — G809 Cerebral palsy, unspecified: Secondary | ICD-10-CM

## 2019-03-18 NOTE — Telephone Encounter (Signed)
Amy, [facility]N calls for [facility] to evaluate for community resources and homecare needs, VO given, do you agree?  She will have labs refaxed, she did not get an anion gap with the BMET, she states lab values looked good, do you want her to draw and anion gap at tomorrows visit?  [phone]

## 2019-03-19 NOTE — Telephone Encounter (Signed)
HHN states pt didn't ever get his wh/ch, so we will need to start over with a dme for wh/ch from [facility]

## 2019-03-19 NOTE — Telephone Encounter (Signed)
Agree, yes

## 2019-03-19 NOTE — Telephone Encounter (Signed)
HHN home health nurse Wh/ch wheelchair [employer] is the company that will supply the wheelchair

## 2019-03-20 ENCOUNTER — Telehealth: Payer: Self-pay | Admitting: *Deleted

## 2019-03-20 NOTE — Telephone Encounter (Signed)
Spoke with PCP. She is happy with results received today on CBC and BMP. Does not need anion gap or repeat BMP drawn next week. Amy notified. Hubbard Hartshorn, BSN, RN-BC

## 2019-03-20 NOTE — Telephone Encounter (Signed)
Spoke with Amy, RN with [facility]. States she already received VO from another triage nurse to draw anion gap and BMP at next week's visit.  Also states w/c order was for manual w/c with certain specifications. Everything was set to go they just needed co-pay from caregiver. Daisy Floro was unable to contact caregiver they voided the order and told Amy the entire process including PT eval would need to be redone. Will contact [employer] reps to ensure this is accurate. Hubbard Hartshorn, RN, BSN

## 2019-03-20 NOTE — Telephone Encounter (Signed)
Received faxed lab results for CBC and BMP drawn 03/12/2019. Placed in PCP's box. Hubbard Hartshorn, RN, BSN

## 2019-03-20 NOTE — Telephone Encounter (Signed)
done

## 2019-03-20 NOTE — Telephone Encounter (Signed)
[place], Germain Osgood, Orvis Brill, RN; [place], Stanford Breed, [place]; [place], [place]; [place], Mamie C, [place]        We would need the below   (1) rx for standard manual wc with 3 accessories such as antitippers, brake extensions, and seat cushion.   (2) face to face or virtual visit notes need to address the need for the wheelchair and must state the below   Patient must have mobility limitations which cannot be resolved with a cane, crutch or walker. Patient can safely self propel the wheelchair or has a caregiver who can assist at all times.   I would say yes because it has been almost a year and we are no longer [place]. The physical therapist can rewrite the evaluation and make an addendum that indicates the patient's diagnosis's still require a wheelchair and send that in addition to the face to face / virtual visit notes.   Thanks

## 2019-03-21 ENCOUNTER — Telehealth: Payer: Self-pay | Admitting: *Deleted

## 2019-03-21 NOTE — Telephone Encounter (Signed)
Call from [facility] from [facility] - requesting Verbal Order "Continue skilled nursing (which was started on Sept 12th) for once a week x 5 weeks; once every other week x 4 weeks then PRN x 2 . VO given - if not appropriate, let me know. Thanks

## 2019-03-21 NOTE — Telephone Encounter (Signed)
Appropriate, approve

## 2019-03-25 NOTE — Telephone Encounter (Signed)
Placed orders, please let me know if any issues.

## 2019-03-31 ENCOUNTER — Other Ambulatory Visit: Payer: Self-pay

## 2019-03-31 ENCOUNTER — Ambulatory Visit: Payer: Medicare Other | Admitting: Internal Medicine

## 2019-03-31 DIAGNOSIS — G809 Cerebral palsy, unspecified: Secondary | ICD-10-CM

## 2019-03-31 NOTE — Telephone Encounter (Signed)
[place], Chilon F  [place], Orvis Brill, RN  Cc: [place], [place], [place]        [place] Req for [employer] to offer [place] today. His sister is unable to do the [place] visit requested for today per Dr Maricela Bo for this DME Order. Please call his sister Arbie Cookey back .

## 2019-03-31 NOTE — Telephone Encounter (Signed)
Community message sent to Austin [name] at [facility] that order for manual w/c has been placed and OV was today. Hubbard Hartshorn, BSN, RN-BC

## 2019-03-31 NOTE — Telephone Encounter (Signed)
Call placed to patient's sister, Arbie Cookey. [place] telehealth appt sched for today to discuss need for new manual w/c. L. Mackensie Pilson, BSN, RN-BC

## 2019-03-31 NOTE — Progress Notes (Signed)
   CC: Needing a wheelchair  This is a telephone encounter between Austin [name] and [employer] on 03/31/2019 for needing a wheelchair. The visit was conducted with the patient located at home and [facility] at [facility]. The patient's identity was confirmed using their DOB and current address. The his/her legal guardian has consented to being evaluated through a telephone encounter and understands the associated risks (an examination cannot be done and the patient may need to come in for an appointment) / benefits (allows the patient to remain at home, decreasing exposure to coronavirus). I personally spent 2 minutes on medical discussion.   HPI:  [name] is a 43 y.o. with PMH as below.   Please see A&P for assessment of the patient's acute and chronic medical conditions.   Past Medical History:  Diagnosis Date  . Cerebral palsy ([place])   . Nonspecific elevation of levels of transaminase and lactic acid dehydrogenase (LDH)   . Seizure disorder [facility])    Review of Systems:  Performed and all others negative.  Assessment & Plan:   See Encounters Tab for problem based charting.  Patient discussed with Dr. Dareen Piano

## 2019-03-31 NOTE — Assessment & Plan Note (Addendum)
Patient with spasticity diplegic cerebral palsy since birth. He has been wheelchair-bound for several years. He currently has a Barista wheelchair and has an aide or his sister to help him navigate. He is completely dependent on others for his ADLs.   Given the patient's cerebral palsy he is not able to overcome these mobility limitations with the use of a cane, crutch, or walker. He does currently have a caregiver to help him safely propel the wheelchair. I am referring patient for a PT eval for custom manual wheelchair specifically for ultra lightweight manual wheelchair.

## 2019-03-31 NOTE — Telephone Encounter (Signed)
Saw that this was completed today in acc. Thank you

## 2019-04-01 ENCOUNTER — Telehealth: Payer: Self-pay | Admitting: Internal Medicine

## 2019-04-01 NOTE — Telephone Encounter (Signed)
In error

## 2019-04-01 NOTE — Progress Notes (Signed)
Internal Medicine Clinic Attending  Case discussed with Dr. Helberg at the time of the visit.  We reviewed the resident's history and exam and pertinent patient test results.  I agree with the assessment, diagnosis, and plan of care documented in the resident's note.    

## 2019-04-01 NOTE — Addendum Note (Signed)
Addended by: Ina Homes T on: 04/01/2019 04:04 PM   Modules accepted: Orders

## 2019-04-09 ENCOUNTER — Telehealth: Payer: Self-pay | Admitting: *Deleted

## 2019-04-09 NOTE — Telephone Encounter (Signed)
Pt's caregiver is calling for copies of the pysch form you found during visit, she states you said you would mail it. See triage for mailing

## 2019-04-10 NOTE — Telephone Encounter (Signed)
It was mailed out on 10/5 but we can mail another one. Thank you.

## 2019-04-15 ENCOUNTER — Telehealth: Payer: Self-pay

## 2019-04-15 NOTE — Telephone Encounter (Signed)
Aimee with [facility] requesting to speak with a nurse about having the patient scheduled an appt for Face to Face through Zoom. States insurance did not accept telehealth, so the visit with [place] did not covered. Please call Aimee back with [facility].

## 2019-04-17 NOTE — Telephone Encounter (Signed)
Per Amy from [facility] is calling back, pls contact [phone]

## 2019-04-18 NOTE — Telephone Encounter (Signed)
Call placed to patient's mother to schedule in person F2F so [facility] visits will be covered. No answer. Left message on VM requesting return call. Hubbard Hartshorn, BSN, RN-BC

## 2019-05-07 NOTE — Telephone Encounter (Signed)
Ok thank you for letting me know. Is there another way we can go about this?

## 2019-05-07 NOTE — Telephone Encounter (Signed)
Received call from Andria Rhein at [facility] that patient's sister is not willing to bring patient to his PT Eval for his w/c assessment. Also, Sister is unwilling to commit to paying co-pay for new w/c. They are unable to proceed at this time. Hubbard Hartshorn, BSN, RN-BC

## 2019-05-27 NOTE — Telephone Encounter (Addendum)
Spoke with patient's sister, Christen Bame. She is unwilling to bring patient in for the PT Eval as he won't keep a mask on and she does not want to risk his health during Covid pandemic. She is not interested in PT coming to the home. States she does PT with patient and would not be available for a therapist to come to home due to her work schedule. She understands that order for custom manual w/c will be closed at this time. She would like to move forward with obtaining wheel chair once Covid pandemic is over. Andria Rhein at [facility] has been notified of her decision. Will ask Jackelyn Poling if it is possible to use previous PT Eval. L. Moranda Billiot, BSN, RN-BC

## 2019-05-27 NOTE — Telephone Encounter (Signed)
Austin [name], Orvis Brill, RN; [place], Veatrice Bourbon; [place], [place], [place]        Lauren -   Thank you for the follow up. I'm sorry but insurance will deny a PT eval from a year ago. We will close this referral and please let me know when patient is ready to move forward. Thank you so much!

## 2019-05-27 NOTE — Telephone Encounter (Signed)
Albertina Parr, Debbie N  [place], Orvis Brill, RN        Hi Lauren -   Just following up on this patient. I believe after we spoke you were going to contact the sister to discuss that if patient wants to proceed we will need the PT eval and were going to see if there is a possibility of ordering home health for PT strengthening with the goal of getting the eval completed in the home.   Just checking if you were able to speak with her and what our next steps are.

## 2019-05-29 NOTE — Telephone Encounter (Signed)
Not a problem, thank you for letting me know.

## 2019-06-10 ENCOUNTER — Other Ambulatory Visit: Payer: Self-pay | Admitting: Internal Medicine

## 2019-06-10 DIAGNOSIS — G40909 Epilepsy, unspecified, not intractable, without status epilepticus: Secondary | ICD-10-CM

## 2019-06-11 NOTE — Telephone Encounter (Signed)
Patient needs appointment.

## 2019-06-11 NOTE — Telephone Encounter (Signed)
Please schedule pt an appt per Dr Maricela Bo

## 2019-08-07 ENCOUNTER — Encounter: Payer: Medicare Other | Admitting: Internal Medicine

## 2019-08-26 ENCOUNTER — Telehealth: Payer: Self-pay | Admitting: *Deleted

## 2019-08-26 ENCOUNTER — Encounter: Payer: Self-pay | Admitting: Internal Medicine

## 2019-08-26 NOTE — Telephone Encounter (Signed)
Attempted to call pt's caregiver and no answers, no vmail Will continue to call for appointment and assist in arranging appointment

## 2019-09-14 ENCOUNTER — Other Ambulatory Visit: Payer: Self-pay | Admitting: Internal Medicine

## 2019-09-14 DIAGNOSIS — G40909 Epilepsy, unspecified, not intractable, without status epilepticus: Secondary | ICD-10-CM

## 2019-09-17 NOTE — Telephone Encounter (Signed)
Spoke with the patient Caregiver his Austin [name].   The patient has not had his COVID Vaccination and she will be holding off on getting him the shot.  Per [name] the patient is not able to com In-person to a visit at this time.  Please call back and speak with [name] in regards to an in person visit at this time.

## 2019-12-12 ENCOUNTER — Encounter: Payer: Self-pay | Admitting: *Deleted

## 2020-12-28 ENCOUNTER — Encounter: Payer: Self-pay | Admitting: *Deleted

## 2021-02-21 ENCOUNTER — Telehealth: Payer: Self-pay | Admitting: Internal Medicine

## 2021-02-21 ENCOUNTER — Other Ambulatory Visit: Payer: Self-pay

## 2021-02-21 DIAGNOSIS — G40909 Epilepsy, unspecified, not intractable, without status epilepticus: Secondary | ICD-10-CM

## 2021-02-21 MED ORDER — CARBAMAZEPINE 200 MG PO TABS: 200.0000 mg | ORAL_TABLET | Freq: Two times a day (BID) | ORAL | 0 refills | Status: DC

## 2021-02-21 NOTE — Telephone Encounter (Signed)
I called to speak to Austin [name], Austin [name], his caregiver.  [name] has a chronic seizure disorder which has been treated with carbamazepine 200 mg bid.  He has not been seen in the clinic for nearly two years due to fear of Covid exposure, and the fact that he is unable to tolerate a mask.  He has not had blood work checked in some time.  [name] states that he has been doing well with regard to the seizure disorder.  The last seizure she recalls was about 2 to 3 months ago and was "very mild".  About 6 months ago she reduced the carbamazepine from 200 mg twice a day to 200 mg once a day "because he was doing so well".  He has not had any increase in seizure frequency since that time.  He has not returned to a neurology due to the mask requirement.  We discussed the challenges of bringing [name] to the clinic.  [name] is very concerned about exposure to pathogens in the community.  He has not been vaccinated for COVID as she is concerned about potential adverse reactions.  We agreed that I would provide a 1 month prescription for his carbamazepine and that her preference is to establish an appointment for her brother to be evaluated by her own PCP at another practice-where there are fewer people and she has a greater comfort level.  If she chooses to transfer care entirely, she will inform our clinic and we will remove our [facility] PCP assignment.  She will also explore home-based medical practices.  [name] appreciated the call and agrees with plan which me established together.

## 2021-02-21 NOTE — Telephone Encounter (Signed)
carbamazepine (TEGRETOL) 200 MG tablet, REFILL REQUEST @ [id number] - [place], [place] - [address]

## 2021-03-17 ENCOUNTER — Telehealth: Payer: Self-pay

## 2021-03-17 NOTE — Telephone Encounter (Signed)
Pt's sister requesting a letter about his health condition. Please call back.

## 2021-03-17 NOTE — Telephone Encounter (Signed)
RTC to patient's sister [facility]) [name], she is requesting a letter which states the patient's condition is the same and he has had no changes in mobility.  He is still needing aide service 2hours/day for assistance with daily living.  Patient has not been seen since 03/31/2019 d/t not being able to wear a mask.  Dr. Mayford Knife had a discussion with patient's sister on 02/21/21 regarding this, as well as, seeking care at another PCP.  Pt's sister states "the other PCP won't work either because he will still need to wear a mask".  RN asked sister if she explored any home based medical practices per Dr. Mayford Knife note and pt states, "that was more Dr. Mayford Knife idea and I don't even know where to start with that".  RN asked sister if patient was still speaking to a caseworker at [facility]?  Sister states, "no, that was a shamble".  Will forward to PCP and Dr. Mayford Knife to advise.   Dr. Mayford Knife, if patient won't come in, do you think a referral to [facility] could assist in finding home based medical  care? Thank you, SChaplin, RN,BSN

## 2021-03-18 NOTE — Telephone Encounter (Signed)
It is noted that EC called back and made an appt for patient via front desk for 03/22/21 w/ Dr. Sande Brothers. SChaplin, RN,BSN

## 2021-03-18 NOTE — Telephone Encounter (Signed)
RTC to patient's sister, [name] [facility]).  Reiterated that Dr. Mayford Knife states to start with "[facility]".  RN looked up phone number and this was given to [facility], along with the website.  Sister states "Will [facility] write the letter about his current condition not changing, I need this today for him to keep his aide care".  RN explained to [facility] that the patient has not been seen in 2 years and he would need a current evaluation by MD.  Sister becomes angry and raises her voice, states "We cannot help the current situation with the pandemic and he cannot wear a mask.  He can't come up to the hospital to be seen, y'all are refusing to see him and I will hold [facility] responsible if anything happens to him".  RN informed EC that [facility] is not refusing to see patient, appt was offered for evaluation as early as next week and it is hospital policy that all patients and visitors wear a mask.  Sister states he cannot wear a mask.   RN reiterated again that per Dr. Mayford Knife conversation with her on 02/21/21 she was encouraged to call "[facility]" at that time. Sister continues to argue with RN about mask policy at [facility] and "how she will hold everyone responsible for his health".  Sister writes this RN's name down.  RN informed her that this message will be passed along to the office manager and Dr. Mayford Knife. SChaplin, RN,BSN

## 2021-03-23 ENCOUNTER — Ambulatory Visit (INDEPENDENT_AMBULATORY_CARE_PROVIDER_SITE_OTHER): Payer: Medicare Other | Admitting: Internal Medicine

## 2021-03-23 ENCOUNTER — Other Ambulatory Visit: Payer: Self-pay

## 2021-03-23 VITALS — BP 136/100 | HR 61 | Temp 97.6°F

## 2021-03-23 DIAGNOSIS — G40909 Epilepsy, unspecified, not intractable, without status epilepticus: Secondary | ICD-10-CM | POA: Diagnosis not present

## 2021-03-23 DIAGNOSIS — Z23 Encounter for immunization: Secondary | ICD-10-CM

## 2021-03-23 DIAGNOSIS — Z13220 Encounter for screening for lipoid disorders: Secondary | ICD-10-CM | POA: Diagnosis not present

## 2021-03-23 DIAGNOSIS — Z0000 Encounter for general adult medical examination without abnormal findings: Secondary | ICD-10-CM

## 2021-03-23 DIAGNOSIS — G809 Cerebral palsy, unspecified: Secondary | ICD-10-CM | POA: Diagnosis not present

## 2021-03-23 DIAGNOSIS — R030 Elevated blood-pressure reading, without diagnosis of hypertension: Secondary | ICD-10-CM

## 2021-03-23 MED ORDER — CARBAMAZEPINE 200 MG PO TABS: 200.0000 mg | ORAL_TABLET | Freq: Two times a day (BID) | ORAL | 0 refills | Status: DC

## 2021-03-23 NOTE — Patient Instructions (Addendum)
Austin [name],   It was a pleasure seeing you today! Today we discussed your requirement for additional aid at home. I will attach a letter to present to your nursing service for continued aid. We are also checking your basic labs today. I will also place a referral back to neurology for follow up on your seizure disorder. I will also refill your seizure medications today. We will have you follow up with our clinic for a telephone appointment in 6 weeks. Have a good day! Dolan Amen, MD

## 2021-03-23 NOTE — Progress Notes (Signed)
.    CC: Needs for home health  HPI:  Mr.Dallen Rood is a 45 y.o. person, with a PMH noted below, who presents to the clinic needs for home health. To see the management of their acute and chronic conditions, please see the A&P note under the Encounters tab.   Past Medical History:  Diagnosis Date   Cerebral palsy (HCC)    Nonspecific elevation of levels of transaminase and lactic acid dehydrogenase (LDH)    Seizure disorder (HCC)    Review of Systems:   Review of Systems  Constitutional:  Negative for chills, fever, malaise/fatigue and weight loss.  Respiratory:  Negative for cough.   Cardiovascular:  Negative for chest pain and palpitations.  Gastrointestinal:  Negative for abdominal pain, constipation, diarrhea, nausea and vomiting.    Physical Exam:  Vitals:   03/23/21 1116  BP: (!) 136/100  Pulse: 61  Temp: 97.6 F (36.4 C)  TempSrc: Axillary  SpO2: 100%   Physical Exam Constitutional:      General: He is not in acute distress.    Appearance: Normal appearance. He is not toxic-appearing.     Comments: Sitting comfortably in wheelchair minimally interactive with interviewer, able to follow basic commands  Cardiovascular:     Rate and Rhythm: Normal rate and regular rhythm.     Pulses: Normal pulses.     Heart sounds: Normal heart sounds. No murmur heard.   No friction rub. No gallop.  Pulmonary:     Effort: Pulmonary effort is normal.     Breath sounds: Normal breath sounds. No wheezing, rhonchi or rales.  Chest:     Chest wall: No tenderness.  Abdominal:     General: Abdomen is flat. Bowel sounds are normal.     Palpations: Abdomen is soft.     Tenderness: There is no abdominal tenderness.  Musculoskeletal:     Comments: Right arm chronically contracted.  Skin:    General: Skin is warm and dry.  Neurological:     Mental Status: He is alert.     Assessment & Plan:   See Encounters Tab for problem based charting.  Patient discussed with Dr. Oswaldo Done

## 2021-03-24 LAB — CBC
Hematocrit: 40.6 % (ref 37.5–51.0)
Hemoglobin: 13.3 g/dL (ref 13.0–17.7)
MCH: 26.7 pg (ref 26.6–33.0)
MCHC: 32.8 g/dL (ref 31.5–35.7)
MCV: 81 fL (ref 79–97)
Platelets: 208 10*3/uL (ref 150–450)
RBC: 4.99 x10E6/uL (ref 4.14–5.80)
RDW: 13.2 % (ref 11.6–15.4)
WBC: 5.5 10*3/uL (ref 3.4–10.8)

## 2021-03-24 LAB — CMP14 + ANION GAP
ALT: 10 IU/L (ref 0–44)
AST: 14 IU/L (ref 0–40)
Albumin/Globulin Ratio: 1.2 (ref 1.2–2.2)
Albumin: 4.3 g/dL (ref 4.0–5.0)
Alkaline Phosphatase: 109 IU/L (ref 44–121)
Anion Gap: 15 mmol/L (ref 10.0–18.0)
BUN/Creatinine Ratio: 11 (ref 9–20)
BUN: 10 mg/dL (ref 6–24)
Bilirubin Total: <0.2 mg/dL (ref 0.0–1.2)
CO2: 24 mmol/L (ref 20–29)
Calcium: 9.2 mg/dL (ref 8.7–10.2)
Chloride: 104 mmol/L (ref 96–106)
Creatinine, Ser: 0.92 mg/dL (ref 0.76–1.27)
Globulin, Total: 3.5 g/dL (ref 1.5–4.5)
Glucose: 88 mg/dL (ref 70–99)
Potassium: 4 mmol/L (ref 3.5–5.2)
Sodium: 143 mmol/L (ref 134–144)
Total Protein: 7.8 g/dL (ref 6.0–8.5)
eGFR: 103 mL/min/{1.73_m2} (ref >59)

## 2021-03-24 LAB — LIPID PANEL
Chol/HDL Ratio: 2.6 ratio (ref 0.0–5.0)
Cholesterol, Total: 164 mg/dL (ref 100–199)
HDL: 62 mg/dL (ref >39)
LDL Chol Calc (NIH): 89 mg/dL (ref 0–99)
Triglycerides: 69 mg/dL (ref 0–149)
VLDL Cholesterol Cal: 13 mg/dL (ref 5–40)

## 2021-03-25 ENCOUNTER — Encounter: Payer: Self-pay | Admitting: Internal Medicine

## 2021-03-25 NOTE — Assessment & Plan Note (Addendum)
Patient presents for follow up for his cerebral palsy. He is fully reliant on his caregivers for all of his ADLs. He has been having assistance from a home health nursing agency, and does require a letter for reverification. Additionally, the patient does require a power chair that was started before the COVID pandemic, but was lost to follow up and will need further evaluation. They are interested in keeping care with [facility] and will begin to follow back in the clinic. We will additionally collect basic labs today, as his last labs were 2+ year ago.  - CBC, CMP, lipid - Letter for [facility] provided - Follow up in 6 weeks for tele health for power chair  ADDENDUM:  Called to update family on blood work, was unsuccessful but did leave a HIPAA compliant VM

## 2021-03-25 NOTE — Progress Notes (Signed)
Internal Medicine Clinic Attending ? ?Case discussed with Dr. Winters  At the time of the visit.  We reviewed the resident?s history and exam and pertinent patient test results.  I agree with the assessment, diagnosis, and plan of care documented in the resident?s note.  ?

## 2021-03-25 NOTE — Assessment & Plan Note (Deleted)
-  Received flu vaccine.

## 2021-03-25 NOTE — Assessment & Plan Note (Signed)
Patient with seizure disorder presents to the clinic for follow up. Patient has been well controlled on regimen of  Tegretol 200 mg twice daily. He was previously following with neurology for this, but was lost to follow up 2/2 to the COVID pandemic. Will have him reestablish with neurology and refill medication today.  - Amb referral to Neurology  - Refill Tegretol 200 mg BID

## 2021-04-07 NOTE — Assessment & Plan Note (Addendum)
Received Flu Vaccine.

## 2021-04-12 ENCOUNTER — Encounter: Payer: Self-pay | Admitting: Internal Medicine

## 2021-04-12 DIAGNOSIS — Z13220 Encounter for screening for lipoid disorders: Secondary | ICD-10-CM | POA: Insufficient documentation

## 2021-04-19 ENCOUNTER — Telehealth: Payer: Self-pay

## 2021-04-19 NOTE — Telephone Encounter (Signed)
Letter signed in patient chart and left at front desk with signature to be picked up by sister.

## 2021-04-19 NOTE — Telephone Encounter (Signed)
Informed patient's sister, [name], that the letter was ready for pickup.  Asked me to place in mail.  Letter placed in outgoing mail box in clinic.

## 2021-04-19 NOTE — Telephone Encounter (Signed)
Return call to pt's sister, [name]. Stated she received a e-mail from [employer], who provides pt with PCS services, needs a letter from pt's doctor so pt can continue receiving their services stating: " Pt has a life time disability and this diagnosis is still as it appears on the psychological form in 2012 from [facility] as Cerebral Palsy as his primary diagnosis". The [facility] form is in [place]. And she will pick up letter when ready. Thanks

## 2021-04-19 NOTE — Telephone Encounter (Signed)
Pt's sister requesting to speak with a nurse about getting a letter. Please call back.

## 2021-04-27 ENCOUNTER — Telehealth: Payer: Self-pay

## 2021-04-27 NOTE — Telephone Encounter (Signed)
Open error 

## 2021-05-01 ENCOUNTER — Other Ambulatory Visit: Payer: Self-pay | Admitting: Internal Medicine

## 2021-05-01 DIAGNOSIS — G40909 Epilepsy, unspecified, not intractable, without status epilepticus: Secondary | ICD-10-CM

## 2021-06-20 ENCOUNTER — Other Ambulatory Visit: Payer: Self-pay | Admitting: Student

## 2021-06-20 DIAGNOSIS — G40909 Epilepsy, unspecified, not intractable, without status epilepticus: Secondary | ICD-10-CM

## 2021-06-24 NOTE — Telephone Encounter (Signed)
Refill for 30d of carbamazepine sent to patient pharmacy. He will need to be evaluated by neurology as soon as possible. Please advise patient and caretaker of recommendations.

## 2021-09-22 ENCOUNTER — Other Ambulatory Visit: Payer: Self-pay | Admitting: Internal Medicine

## 2021-09-22 DIAGNOSIS — G40909 Epilepsy, unspecified, not intractable, without status epilepticus: Secondary | ICD-10-CM

## 2021-12-28 ENCOUNTER — Encounter: Payer: Self-pay | Admitting: Student

## 2021-12-28 ENCOUNTER — Telehealth: Payer: Self-pay | Admitting: *Deleted

## 2021-12-28 ENCOUNTER — Ambulatory Visit (INDEPENDENT_AMBULATORY_CARE_PROVIDER_SITE_OTHER): Payer: Medicare Other | Admitting: Student

## 2021-12-28 ENCOUNTER — Other Ambulatory Visit: Payer: Self-pay

## 2021-12-28 VITALS — BP 127/109 | HR 43

## 2021-12-28 DIAGNOSIS — G40909 Epilepsy, unspecified, not intractable, without status epilepticus: Secondary | ICD-10-CM

## 2021-12-28 DIAGNOSIS — G809 Cerebral palsy, unspecified: Secondary | ICD-10-CM | POA: Diagnosis not present

## 2021-12-28 DIAGNOSIS — Z0001 Encounter for general adult medical examination with abnormal findings: Secondary | ICD-10-CM | POA: Diagnosis not present

## 2021-12-28 DIAGNOSIS — Z13220 Encounter for screening for lipoid disorders: Secondary | ICD-10-CM | POA: Diagnosis not present

## 2021-12-28 DIAGNOSIS — Z136 Encounter for screening for cardiovascular disorders: Secondary | ICD-10-CM

## 2021-12-28 DIAGNOSIS — Z114 Encounter for screening for human immunodeficiency virus [HIV]: Secondary | ICD-10-CM

## 2021-12-28 DIAGNOSIS — R030 Elevated blood-pressure reading, without diagnosis of hypertension: Secondary | ICD-10-CM | POA: Diagnosis not present

## 2021-12-28 DIAGNOSIS — Z0000 Encounter for general adult medical examination without abnormal findings: Secondary | ICD-10-CM

## 2021-12-28 MED ORDER — CARBAMAZEPINE 200 MG PO TABS: 200.0000 mg | ORAL_TABLET | Freq: Two times a day (BID) | ORAL | 2 refills | Status: DC

## 2021-12-28 NOTE — Assessment & Plan Note (Signed)
Assessment: Continuing to take his carbamazepine. Has had three seizures in the past year, did not have to be hospitalized. Has yet to follow up with neurology as unable to keep a mask on. Will place new referral and refill carbamazepine.   Plan: -Continue carbamazepine  -follow up with neurology

## 2021-12-28 NOTE — Patient Instructions (Signed)
Thank you, Austin [name] for allowing [place] to provide your care today. Today we discussed .  Cerebral Palsy We will work to increase the personal care hours you are receiving. We will work to see if there are any mattress covers we can get that will help with your predicament.   Healthcare maintenance We will be sending you home with a colon cancer screening kit. Please follow the instructions as provided. Call if you have any questions.   Seizure I have refilled your seizure medicine and placed a new referral to neurology    I have ordered the following labs for you:  Lab Orders         Fecal occult blood, imunochemical         Lipid Profile         CMP14 + Anion Gap       Referrals ordered today:   Referral Orders         Ambulatory referral to Neurology       I have ordered the following medication/changed the following medications:   Stop the following medications: Medications Discontinued During This Encounter  Medication Reason   carbamazepine (TEGRETOL) 200 MG tablet Reorder     Start the following medications: Meds ordered this encounter  Medications   carbamazepine (TEGRETOL) 200 MG tablet    Sig: Take 1 tablet (200 mg total) by mouth 2 (two) times daily for 270 doses.    Dispense:  90 tablet    Refill:  2     Follow up: 1 year    Should you have any questions or concerns please call the internal medicine clinic at [phone].    Sanjuana Letters, D.O. [place]

## 2021-12-28 NOTE — Telephone Encounter (Signed)
   Telephone encounter was:  Successful.  12/28/2021 Name: Kore Madlock MRN: [id number] DOB: [birth year]  Shafer Swamy is a 46 y.o. year old male who is a primary care patient of Quincy Simmonds, MD . The community resource team was consulted for assistance with Transportation Needs  and PCS  Informed patient sisters about concierge line and if there were needs to reach out as he does not have an insurance transportation benefit, sisters only need is increasing personal care hours if possible  Care guide performed the following interventions: Patient provided with information about care guide support team and interviewed to confirm resource needs.  Follow Up Plan:  No further follow up planned at this time. The patient has been provided with needed resources. Alois Cliche -[facility] Guide , Embedded Care Coordination [facility], Care Management  [phone] [address] , [place] [address] Email : [email or web address]

## 2021-12-28 NOTE — Assessment & Plan Note (Addendum)
Assessment: Patient's sister is his primary caretaker, he has an aid who helps at the house for 2 hours a day for max of 10 hours a week. Sister asking for increasing hours, will work with clinic staff to help with this. Otherwise, doing well. Patient bathed daily by his sister who assess for sacral wounds/pressure ulcers. Denies any currently. Of note, patient also constantly ripping bed covers and then urinates through diaper and onto mattress. Will work with nursing staff to see if other items available to help with this.   Plan: -Continue PCS and work to increase hours.  -Referral to social work placed as well  Addendum: Lipid panel and CMP unremarkable

## 2021-12-28 NOTE — Assessment & Plan Note (Addendum)
Assessment: Patient of age for colon cancer screening. Discussed risk and benefits of screening with his cerebral palsy. Decision was made to pursue FOBT, understanding risk of needing to have colonoscopy if positive. We also discussed PSA testing, no family history of prostate cancer or disease. Patient urinates in diaper and sister notes regular urination. Discussed risk and benefits of PSA testing and how patient's pass from other disease processes and testing for prostate cancer can lead to unnecessary work up and further testing. Decision was made to not puruse PSA testing. Also has not been screened for HIV in the past, will add HIV ab test.   Plan: - follow up FOBT -HIV pending  Addendum: HIV negative

## 2021-12-28 NOTE — Progress Notes (Signed)
CC: Annual visit  HPI:  [name] is a 46 y.o. male living with a history stated below and presents today for annual visit. Please see problem based assessment and plan for additional details.  Past Medical History:  Diagnosis Date   Cerebral palsy (HCC)    Nonspecific elevation of levels of transaminase and lactic acid dehydrogenase (LDH)    Seizure disorder (HCC)     Current Outpatient Medications on File Prior to Visit  Medication Sig Dispense Refill   Vitamins A & D (VITAMIN A & D EX) Apply 1 application topically daily. to scalp.     No current facility-administered medications on file prior to visit.    No family history on file.  Social History   Socioeconomic History   Marital status: Single    Spouse name: Not on file   Number of children: Not on file   Years of education: Not on file   Highest education level: Not on file  Occupational History   Not on file  Tobacco Use   Smoking status: Never   Smokeless tobacco: Not on file  Substance and Sexual Activity   Alcohol use: No    Alcohol/week: 0.0 standard drinks of alcohol   Drug use: No   Sexual activity: Not on file  Other Topics Concern   Not on file  Social History Narrative   Sister, Austin [name] is primary caregiver. Patient lives with her.   Social Determinants of Health   Financial Resource Strain: Not on file  Food Insecurity: Not on file  Transportation Needs: Not on file  Physical Activity: Not on file  Stress: Not on file  Social Connections: Not on file  Intimate Partner Violence: Not on file    Review of Systems: ROS negative except for what is noted on the assessment and plan.  Vitals:   12/28/21 0929  BP: (!) 127/109  Pulse: (!) 43  SpO2: (!) 84%    Physical Exam: Constitutional: no acute distress HENT: normocephalic atraumatic Eyes: conjunctiva non-erythematous Neck: supple Cardiovascular: regular rate and rhythm, no m/r/g Pulmonary/Chest: normal work of breathing  on room air, lungs clear to auscultation bilaterally Abdominal: soft, non-tender, non-distended MSK: cachetic  Neurological: baseline mental status Skin: warm and dry Psych: baseline for patient  Assessment & Plan:   Seizure disorder [facility]) Assessment: Continuing to take his carbamazepine. Has had three seizures in the past year, did not have to be hospitalized. Has yet to follow up with neurology as unable to keep a mask on. Will place new referral and refill carbamazepine.   Plan: -Continue carbamazepine  -follow up with neurology  Cerebral palsy [facility]) Assessment: Patient's sister is his primary caretaker, he has an aid who helps at the house for 2 hours a day for max of 10 hours a week. Sister asking for increasing hours, will work with clinic staff to help with this. Otherwise, doing well. Patient bathed daily by his sister who assess for sacral wounds/pressure ulcers. Denies any currently. Of note, patient also constantly ripping bed covers and then urinates through diaper and onto mattress. Will work with nursing staff to see if other items available to help with this.   Plan: -Continue PCS and work to increase hours.  -Referral to social work placed as well  Preventative health care Assessment: Patient of age for colon cancer screening. Discussed risk and benefits of screening with his cerebral palsy. Decision was made to pursue FOBT, understanding risk of needing to have colonoscopy if positive. We  also discussed PSA testing, no family history of prostate cancer or disease. Patient urinates in diaper and sister notes regular urination. Discussed risk and benefits of PSA testing and how patient's pass from other disease processes and testing for prostate cancer can lead to unnecessary work up and further testing. Decision was made to not puruse PSA testing. Also has not been screened for HIV in the past, will add HIV ab test.   Plan: - follow up FOBT   Single episode of  elevated blood pressure BP elevated today as well as hypoxic and tachypnic. Patient constantly moving when nursing staff attempting to check vital signs.Do not believe vital sign are accurate. He is resting comfortably in his chair, with a respiratory rate under 20 and in no respiratory distress. He is not tachycardic. Continue to monitor.   Patient discussed with Dr. Dario Ave, D.O. [facility] Internal Medicine, PGY-3 Phone: [phone] Date 12/28/2021 Time 1:23 PM

## 2021-12-28 NOTE — Assessment & Plan Note (Signed)
BP elevated today as well as hypoxic and tachypnic. Patient constantly moving when nursing staff attempting to check vital signs.Do not believe vital sign are accurate. He is resting comfortably in his chair, with a respiratory rate under 20 and in no respiratory distress. He is not tachycardic. Continue to monitor.

## 2021-12-29 ENCOUNTER — Telehealth: Payer: Self-pay | Admitting: *Deleted

## 2021-12-29 LAB — HIV ANTIBODY (ROUTINE TESTING W REFLEX): HIV Screen 4th Generation wRfx: NONREACTIVE

## 2021-12-29 LAB — CMP14 + ANION GAP
ALT: 10 IU/L (ref 0–44)
AST: 16 IU/L (ref 0–40)
Albumin/Globulin Ratio: 1.2 (ref 1.2–2.2)
Albumin: 4.2 g/dL (ref 4.0–5.0)
Alkaline Phosphatase: 107 IU/L (ref 44–121)
Anion Gap: 11 mmol/L (ref 10.0–18.0)
BUN/Creatinine Ratio: 20 (ref 9–20)
BUN: 16 mg/dL (ref 6–24)
Bilirubin Total: <0.2 mg/dL (ref 0.0–1.2)
CO2: 26 mmol/L (ref 20–29)
Calcium: 9.3 mg/dL (ref 8.7–10.2)
Chloride: 107 mmol/L — ABNORMAL HIGH (ref 96–106)
Creatinine, Ser: 0.81 mg/dL (ref 0.76–1.27)
Globulin, Total: 3.5 g/dL (ref 1.5–4.5)
Glucose: 79 mg/dL (ref 70–99)
Potassium: 4 mmol/L (ref 3.5–5.2)
Sodium: 144 mmol/L (ref 134–144)
Total Protein: 7.7 g/dL (ref 6.0–8.5)
eGFR: 109 mL/min/{1.73_m2} (ref >59)

## 2021-12-29 LAB — LIPID PANEL
Chol/HDL Ratio: 2.6 ratio (ref 0.0–5.0)
Cholesterol, Total: 169 mg/dL (ref 100–199)
HDL: 65 mg/dL (ref >39)
LDL Chol Calc (NIH): 92 mg/dL (ref 0–99)
Triglycerides: 64 mg/dL (ref 0–149)
VLDL Cholesterol Cal: 12 mg/dL (ref 5–40)

## 2021-12-29 NOTE — Telephone Encounter (Signed)
Called Austin [name], lvm for call back. She is needing more hours for [facility]. Needing to know the name of the agency who is providing care for him. Patient only has medicare which do not provide PCS.

## 2022-01-05 NOTE — Progress Notes (Signed)
Internal Medicine Clinic Attending  Case discussed with the resident at the time of the visit.  We reviewed the resident's history and exam and pertinent patient test results.  I agree with the assessment, diagnosis, and plan of care documented in the resident's note.  

## 2022-01-10 ENCOUNTER — Telehealth: Payer: Self-pay | Admitting: *Deleted

## 2022-01-10 NOTE — Chronic Care Management (AMB) (Signed)
  Care Coordination  Note  01/10/2022 Name: Austin [name] MRN: [id number] DOB: 1975/10/03  Austin [name] is a 46 y.o. year old male who is a primary care patient of Austin Simmonds, MD. I reached out to Austin [name] by phone today to offer care coordination services.      [name] was given information about Care Coordination services today including:  The Care Coordination services include support from the care team which includes your Nurse Coordinator, Clinical Social Worker, or Pharmacist.  The Care Coordination team is here to help remove barriers to the health concerns and goals most important to you. Care Coordination services are voluntary and the patient may decline or stop services at any time by request to their care team member.   Sister Austin [name],Austin [name]  verbally agreed to assistance and services provided by the Care Coordination team today.  Follow up plan: Telephone appointment with care coordination team member scheduled for:01/17/22  [facility] Coordination Care Guide  Direct Dial: [phone]

## 2022-01-17 ENCOUNTER — Ambulatory Visit: Payer: Self-pay | Admitting: Licensed Clinical Social Worker

## 2022-01-17 NOTE — Patient Outreach (Addendum)
  Care Coordination   Initial Visit Note   01/17/2022 Name: Austin [name] MRN: [id number] DOB: 03-08-1976  Austin [name] is a 46 y.o. year old male who sees Quincy Simmonds, MD for primary care. Unsuccessful outreach to patient on today. SW attempted all numbers on file. SW reviewed chart for additional contact information. SW reviewed chart to determine any barriers or needs. SW will continue efforts and repeat attempt on 01/17/2022  What matters to the patients health and wellness today?  Initial Outreach   SDOH assessments and interventions completed:   No   Care Coordination Interventions Activated:  Yes Care Coordination Interventions:  No, not indicated  Follow up plan: Follow up call scheduled for 07/26  Encounter Outcome:  Pt. Scheduled  Ander Gaster, MSW  Social Worker [facility] Care Management  [phone]

## 2022-01-18 ENCOUNTER — Ambulatory Visit: Payer: Self-pay | Admitting: Licensed Clinical Social Worker

## 2022-01-18 NOTE — Patient Outreach (Signed)
  Care Coordination   Initial Visit Note   01/18/2022 Name: Austin [name] MRN: [id number] DOB: 31-Mar-1976  Austin [name] is a 46 y.o. year old male who sees Quincy Simmonds, MD for primary care. SW attempted all numbers on file. Patient mailbox is full and SW unable to leave VM. SW will attempt again on 07/27. Patient has SW contact information to return call.  What matters to the patients health and wellness today?  Initial      SDOH assessments and interventions completed:   Yes   Care Coordination Interventions Activated:  Yes Care Coordination Interventions:  No, not indicated  Follow up plan: Follow up call scheduled for 3rd attempt schedule for 07/27. Patient has SW contact information.   Encounter Outcome:  Pt. Visit Completed

## 2022-06-09 ENCOUNTER — Ambulatory Visit: Payer: Self-pay | Admitting: Licensed Clinical Social Worker

## 2022-06-09 NOTE — Patient Outreach (Signed)
SW removed from care team.  Rheana Casebolt, BSW , MSW, LCSW-A Social Worker [facility] Care Management  [phone]   

## 2022-09-13 ENCOUNTER — Other Ambulatory Visit: Payer: Self-pay | Admitting: Student

## 2022-09-13 DIAGNOSIS — G40909 Epilepsy, unspecified, not intractable, without status epilepticus: Secondary | ICD-10-CM

## 2022-09-13 MED ORDER — CARBAMAZEPINE 200 MG PO TABS: 200.0000 mg | ORAL_TABLET | Freq: Two times a day (BID) | ORAL | 2 refills | Status: DC

## 2022-09-13 NOTE — Telephone Encounter (Signed)
carbamazepine (TEGRETOL) 200 MG tablet (Expired)   [id number] - [place], [place] [facility] [address]

## 2022-11-23 ENCOUNTER — Encounter: Payer: Self-pay | Admitting: *Deleted

## 2023-03-23 ENCOUNTER — Other Ambulatory Visit: Payer: Self-pay | Admitting: Student

## 2023-03-23 DIAGNOSIS — G40909 Epilepsy, unspecified, not intractable, without status epilepticus: Secondary | ICD-10-CM

## 2023-03-27 ENCOUNTER — Other Ambulatory Visit: Payer: Self-pay | Admitting: Student

## 2023-03-27 DIAGNOSIS — G40909 Epilepsy, unspecified, not intractable, without status epilepticus: Secondary | ICD-10-CM

## 2023-06-14 ENCOUNTER — Telehealth: Payer: Self-pay | Admitting: *Deleted

## 2023-06-14 NOTE — Telephone Encounter (Addendum)
Patient's sister called asking about [facility] another [facility].  Patient's sister was advised to call the [facility] on [address] and inform them that she would like for patient's prescription to be transferred to their [facility].

## 2023-06-18 ENCOUNTER — Other Ambulatory Visit (HOSPITAL_COMMUNITY): Payer: Self-pay

## 2023-06-18 ENCOUNTER — Other Ambulatory Visit: Payer: Self-pay

## 2023-06-18 DIAGNOSIS — G40909 Epilepsy, unspecified, not intractable, without status epilepticus: Secondary | ICD-10-CM

## 2023-06-18 MED ORDER — CARBAMAZEPINE 200 MG PO TABS: 200.0000 mg | ORAL_TABLET | Freq: Two times a day (BID) | ORAL | 2 refills | Status: DC

## 2023-10-27 ENCOUNTER — Other Ambulatory Visit: Payer: Self-pay | Admitting: Student

## 2023-10-27 DIAGNOSIS — G40909 Epilepsy, unspecified, not intractable, without status epilepticus: Secondary | ICD-10-CM

## 2023-10-29 NOTE — Telephone Encounter (Signed)
 Patient last seen 12/28/21. I attempted to call the patient to schedule a appointment. Unable to reach the patient so I lvm.

## 2024-02-22 ENCOUNTER — Ambulatory Visit: Admitting: Student

## 2024-02-22 ENCOUNTER — Encounter: Payer: Self-pay | Admitting: Student

## 2024-02-22 ENCOUNTER — Other Ambulatory Visit: Payer: Self-pay

## 2024-02-22 VITALS — Ht 69.0 in | Wt 109.0 lb

## 2024-02-22 DIAGNOSIS — Z0000 Encounter for general adult medical examination without abnormal findings: Secondary | ICD-10-CM

## 2024-02-22 DIAGNOSIS — Z1211 Encounter for screening for malignant neoplasm of colon: Secondary | ICD-10-CM

## 2024-02-22 DIAGNOSIS — Z131 Encounter for screening for diabetes mellitus: Secondary | ICD-10-CM

## 2024-02-22 DIAGNOSIS — G809 Cerebral palsy, unspecified: Secondary | ICD-10-CM | POA: Diagnosis not present

## 2024-02-22 DIAGNOSIS — G40909 Epilepsy, unspecified, not intractable, without status epilepticus: Secondary | ICD-10-CM | POA: Diagnosis not present

## 2024-02-22 DIAGNOSIS — D649 Anemia, unspecified: Secondary | ICD-10-CM

## 2024-02-22 DIAGNOSIS — Z13220 Encounter for screening for lipoid disorders: Secondary | ICD-10-CM

## 2024-02-22 MED ORDER — CARBAMAZEPINE 200 MG PO TABS: 200.0000 mg | ORAL_TABLET | Freq: Two times a day (BID) | ORAL | 2 refills | Status: AC

## 2024-02-22 NOTE — Assessment & Plan Note (Addendum)
 Related to above. For this he takes carbamazepine .  He typically has seizures about once per month that last for 3 minutes, self terminate.  Note two seizures this month but overall not increased from baseline.  Has not missed medicine.  Saw neurology in the past, but lost to follow-up in the midst of COVID. - Refill carbamazepine  200 twice daily, will get a carbamazepine  level - Refer to neurology, I think as he ages he would benefit from their input

## 2024-02-22 NOTE — Assessment & Plan Note (Addendum)
 Will screen for diabetes, kidney disease, hyperlipidemia, electrolyte abnormalities in the setting of seizures, appropriate carbamazepine  level, anemia, I see that he was anemic in the past.  Also will do Cologuard for colon cancer screening on discussion above method with his family. - Cologuard - CBC, CMP, lipid panel, A1c, carbamazepine  level

## 2024-02-22 NOTE — Patient Instructions (Addendum)
 Today I will collect basic blood work.  I will call if there are any abnormalities that need attention.  Will refill carbamazepine .  Austin [name] would benefit from seeing a neurologist, so I have placed a referral for that.  They may also be able to help connect with further resources for patients with cerebral palsy and seizure disorder.  I have arranged Cologuard test for colon cancer screening.  Expect a kit to arrive in the mail.  Finally I will reach out to our social work team to see if there are any home health aide or other community resources that may benefit Austin [name] and your family overall.  Please return for general checkup in 1 year or sooner if needed.

## 2024-02-22 NOTE — Progress Notes (Signed)
   CC: General checkup   HPI:  [name] is a 48 y.o. male with a PMH stated below who presents today for checkup.  No acute complaints or concerns.  I primarily spoke with the patient's sister, Austin [name].  They do request blood work, colon cancer screening, and refill on carbamazepine .  Please see problem based assessment and plan for additional details.  Past Medical History:  Diagnosis Date   Cerebral palsy (HCC)    Nonspecific elevation of levels of transaminase and lactic acid dehydrogenase (LDH)    Seizure disorder (HCC)    Review of Systems: ROS negative except for what is noted on the assessment and plan.  Vitals:   02/22/24 1000  Weight: 109 lb (49.4 kg)  Height: 5' 9 (1.753 m)   Physical Exam: Constitutional: man in wheelchair in no acute distress. He does not speak with me, rocks in chair, contractures noted in extremities, pleasant. Cardiovascular: regular rate and rhythm, no m/r/g Pulmonary/Chest: normal work of breathing on room air, lungs clear to auscultation bilaterally Abdominal: soft, non-tender, non-distended MSK: underweight, cachexia Neurological: at typical baseline, does not communicate with me, is alert and looks about the room Skin: warm and dry  Assessment & Plan:   Patient discussed with Dr. Trudy  Cerebral palsy [facility]) His family takes primary care of him. I spoke with his sister Austin [name] who is his primary caretaker.  He is also here with his nephew.  Overall he is well and they report no acute concerns.  Request a refill of his seizure medicine, colon cancer screening, blood work.  They note that they are his exclusive caregivers and at times can be overwhelming particularly when he needs lifting and transport. -I will refer to social work to see about home health aide and community resources.  Currently the patient's family is fully responsible for his care.  Seizure disorder (HCC) Related to above. For this he takes carbamazepine .  He  typically has seizures about once per month that last for 3 minutes, self terminate.  Note two seizures this month but overall not increased from baseline.  Has not missed medicine.  Saw neurology in the past, but lost to follow-up in the midst of COVID. - Refill carbamazepine  200 twice daily, will get a carbamazepine  level - Refer to neurology, I think as he ages he would benefit from their input  Preventative health care Will screen for diabetes, kidney disease, hyperlipidemia, electrolyte abnormalities in the setting of seizures, appropriate carbamazepine  level, anemia, I see that he was anemic in the past.  Also will do Cologuard for colon cancer screening on discussion above method with his family. - Cologuard - CBC, CMP, lipid panel, A1c, carbamazepine  level  RTC in 1 year for general follow-up.  Sooner if needed.  He will return to the clinic next week for blood draw of the above labs.  Lonni Africa, D.O. [facility] Internal Medicine, PGY-2 Phone: [phone] Date 02/22/2024 Time 11:59 AM

## 2024-02-22 NOTE — Assessment & Plan Note (Addendum)
 His family takes primary care of him. I spoke with his sister [name] who is his primary caretaker.  He is also here with his nephew.  Overall he is well and they report no acute concerns.  Request a refill of his seizure medicine, colon cancer screening, blood work.  They note that they are his exclusive caregivers and at times can be overwhelming particularly when he needs lifting and transport. -I will refer to social work to see about home health aide and community resources.  Currently the patient's family is fully responsible for his care.

## 2024-02-26 ENCOUNTER — Other Ambulatory Visit

## 2024-02-29 NOTE — Progress Notes (Signed)
 Internal Medicine Clinic Attending  Case discussed with the resident at the time of the visit.  We reviewed the resident's history and exam and pertinent patient test results.  I agree with the assessment, diagnosis, and plan of care documented in the resident's note.

## 2024-04-01 ENCOUNTER — Other Ambulatory Visit (INDEPENDENT_AMBULATORY_CARE_PROVIDER_SITE_OTHER)

## 2024-04-01 DIAGNOSIS — Z13220 Encounter for screening for lipoid disorders: Secondary | ICD-10-CM | POA: Diagnosis present

## 2024-04-01 DIAGNOSIS — D649 Anemia, unspecified: Secondary | ICD-10-CM

## 2024-04-01 DIAGNOSIS — Z131 Encounter for screening for diabetes mellitus: Secondary | ICD-10-CM

## 2024-04-01 DIAGNOSIS — G40909 Epilepsy, unspecified, not intractable, without status epilepticus: Secondary | ICD-10-CM

## 2024-04-02 LAB — CBC
Hematocrit: 43.9 % (ref 37.5–51.0)
Hemoglobin: 14.2 g/dL (ref 13.0–17.7)
MCH: 26.9 pg (ref 26.6–33.0)
MCHC: 32.3 g/dL (ref 31.5–35.7)
MCV: 83 fL (ref 79–97)
Platelets: 258 x10E3/uL (ref 150–450)
RBC: 5.27 x10E6/uL (ref 4.14–5.80)
RDW: 13.3 % (ref 11.6–15.4)
WBC: 5.1 x10E3/uL (ref 3.4–10.8)

## 2024-04-02 LAB — COMPREHENSIVE METABOLIC PANEL WITH GFR
ALT: 14 IU/L (ref 0–44)
AST: 18 IU/L (ref 0–40)
Albumin: 4.4 g/dL (ref 4.1–5.1)
Alkaline Phosphatase: 112 IU/L (ref 47–123)
BUN/Creatinine Ratio: 13 (ref 9–20)
BUN: 13 mg/dL (ref 6–24)
Bilirubin Total: <0.2 mg/dL (ref 0.0–1.2)
CO2: 23 mmol/L (ref 20–29)
Calcium: 9.3 mg/dL (ref 8.7–10.2)
Chloride: 103 mmol/L (ref 96–106)
Creatinine, Ser: 0.97 mg/dL (ref 0.76–1.27)
Globulin, Total: 3.5 g/dL (ref 1.5–4.5)
Glucose: 73 mg/dL (ref 70–99)
Potassium: 4.2 mmol/L (ref 3.5–5.2)
Sodium: 140 mmol/L (ref 134–144)
Total Protein: 7.9 g/dL (ref 6.0–8.5)
eGFR: 95 mL/min/1.73 (ref >59)

## 2024-04-02 LAB — HEMOGLOBIN A1C
Est. average glucose Bld gHb Est-mCnc: 105 mg/dL
Hgb A1c MFr Bld: 5.3 % (ref 4.8–5.6)

## 2024-04-02 LAB — LIPID PANEL
Chol/HDL Ratio: 2.7 ratio (ref 0.0–5.0)
Cholesterol, Total: 170 mg/dL (ref 100–199)
HDL: 62 mg/dL (ref >39)
LDL Chol Calc (NIH): 95 mg/dL (ref 0–99)
Triglycerides: 70 mg/dL (ref 0–149)
VLDL Cholesterol Cal: 13 mg/dL (ref 5–40)

## 2024-04-02 LAB — CARBAMAZEPINE LEVEL, TOTAL: Carbamazepine (Tegretol), S: 4 ug/mL (ref 4.0–12.0)

## 2024-04-08 ENCOUNTER — Ambulatory Visit: Payer: Self-pay | Admitting: Student
